# Patient Record
Sex: Male | Born: 1955
Health system: Southern US, Community
[De-identification: ages and names within clinical notes are randomized; demographics above are authoritative.]

## PROBLEM LIST (undated history)

## (undated) DIAGNOSIS — I10 Essential (primary) hypertension: Secondary | ICD-10-CM

## (undated) DIAGNOSIS — E119 Type 2 diabetes mellitus without complications: Secondary | ICD-10-CM

## (undated) HISTORY — DX: Type 2 diabetes mellitus without complications: E11.9

## (undated) HISTORY — DX: Essential (primary) hypertension: I10

---
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
## 1985-11-24 HISTORY — PX: APPENDECTOMY: SHX54

## 1990-11-24 HISTORY — PX: SHOULDER SURGERY: SHX246

## 2002-11-24 HISTORY — PX: HERNIA REPAIR: SHX51

## 2005-08-05 ENCOUNTER — Ambulatory Visit: Payer: Self-pay | Admitting: Podiatry

## 2005-08-19 ENCOUNTER — Encounter: Payer: Self-pay | Admitting: Podiatry

## 2005-08-24 ENCOUNTER — Encounter: Payer: Self-pay | Admitting: Podiatry

## 2005-10-10 ENCOUNTER — Ambulatory Visit: Payer: Self-pay | Admitting: Podiatry

## 2006-11-24 HISTORY — PX: ANKLE SURGERY: SHX546

## 2007-05-21 ENCOUNTER — Ambulatory Visit: Payer: Self-pay | Admitting: Surgery

## 2007-05-21 ENCOUNTER — Other Ambulatory Visit: Payer: Self-pay

## 2007-05-27 ENCOUNTER — Ambulatory Visit: Payer: Self-pay | Admitting: Surgery

## 2011-12-27 ENCOUNTER — Emergency Department: Payer: Self-pay | Admitting: Emergency Medicine

## 2011-12-27 LAB — COMPREHENSIVE METABOLIC PANEL
Albumin: 3.9 g/dL (ref 3.4–5.0)
Alkaline Phosphatase: 50 U/L (ref 50–136)
Anion Gap: 13 (ref 7–16)
BUN: 14 mg/dL (ref 7–18)
Calcium, Total: 8.9 mg/dL (ref 8.5–10.1)
Chloride: 99 mmol/L (ref 98–107)
Creatinine: 1.01 mg/dL (ref 0.60–1.30)
EGFR (African American): >60
Glucose: 128 mg/dL — ABNORMAL HIGH (ref 65–99)
Osmolality: 274 (ref 275–301)
Sodium: 136 mmol/L (ref 136–145)
Total Protein: 7.9 g/dL (ref 6.4–8.2)

## 2011-12-27 LAB — CBC WITH DIFFERENTIAL/PLATELET
Basophil #: 0.1 10*3/uL (ref 0.0–0.1)
Basophil %: 0.6 %
Eosinophil %: 2.1 %
Lymphocyte #: 3.8 10*3/uL — ABNORMAL HIGH (ref 1.0–3.6)
Lymphocyte %: 38.2 %
MCV: 90 fL (ref 80–100)
Monocyte #: 0.8 10*3/uL — ABNORMAL HIGH (ref 0.0–0.7)
Monocyte %: 8.1 %
Neutrophil %: 51 %
Platelet: 236 10*3/uL (ref 150–440)
RBC: 4.76 10*6/uL (ref 4.40–5.90)
WBC: 9.9 10*3/uL (ref 3.8–10.6)

## 2011-12-27 LAB — PROTIME-INR
INR: 0.9
Prothrombin Time: 12.3 secs (ref 11.5–14.7)

## 2011-12-27 LAB — URINALYSIS, COMPLETE
Blood: NEGATIVE
Ketone: NEGATIVE
Nitrite: NEGATIVE
Protein: NEGATIVE
Specific Gravity: 1.004 (ref 1.003–1.030)

## 2011-12-27 LAB — CK TOTAL AND CKMB (NOT AT ARMC): CK, Total: 165 U/L (ref 35–232)

## 2011-12-27 LAB — ETHANOL: Ethanol %: 0.191 % — ABNORMAL HIGH (ref 0.000–0.080)

## 2012-11-24 HISTORY — PX: HERNIA REPAIR: SHX51

## 2013-12-22 ENCOUNTER — Ambulatory Visit: Payer: Self-pay | Admitting: Anesthesiology

## 2013-12-22 DIAGNOSIS — I10 Essential (primary) hypertension: Secondary | ICD-10-CM

## 2013-12-22 LAB — CBC WITH DIFFERENTIAL/PLATELET
BASOS ABS: 0.1 10*3/uL (ref 0.0–0.1)
Basophil %: 0.7 %
Eosinophil #: 0.1 10*3/uL (ref 0.0–0.7)
Eosinophil %: 1.4 %
HCT: 44.4 % (ref 40.0–52.0)
HGB: 14.8 g/dL (ref 13.0–18.0)
Lymphocyte #: 2.3 10*3/uL (ref 1.0–3.6)
Lymphocyte %: 24 %
MCH: 30.4 pg (ref 26.0–34.0)
MCHC: 33.2 g/dL (ref 32.0–36.0)
MCV: 92 fL (ref 80–100)
MONOS PCT: 7.1 %
Monocyte #: 0.7 x10 3/mm (ref 0.2–1.0)
Neutrophil #: 6.3 10*3/uL (ref 1.4–6.5)
Neutrophil %: 66.8 %
Platelet: 244 10*3/uL (ref 150–440)
RBC: 4.85 10*6/uL (ref 4.40–5.90)
RDW: 13 % (ref 11.5–14.5)
WBC: 9.5 10*3/uL (ref 3.8–10.6)

## 2013-12-22 LAB — BASIC METABOLIC PANEL
Anion Gap: 3 — ABNORMAL LOW (ref 7–16)
BUN: 17 mg/dL (ref 7–18)
CHLORIDE: 99 mmol/L (ref 98–107)
CREATININE: 0.99 mg/dL (ref 0.60–1.30)
Calcium, Total: 9.7 mg/dL (ref 8.5–10.1)
Co2: 30 mmol/L (ref 21–32)
EGFR (African American): >60
EGFR (Non-African Amer.): >60
GLUCOSE: 96 mg/dL (ref 65–99)
OSMOLALITY: 266 (ref 275–301)
POTASSIUM: 4.1 mmol/L (ref 3.5–5.1)
Sodium: 132 mmol/L — ABNORMAL LOW (ref 136–145)

## 2013-12-28 ENCOUNTER — Ambulatory Visit: Payer: Self-pay | Admitting: Surgery

## 2013-12-30 LAB — PATHOLOGY REPORT

## 2014-03-22 DIAGNOSIS — I10 Essential (primary) hypertension: Secondary | ICD-10-CM | POA: Insufficient documentation

## 2015-01-12 ENCOUNTER — Ambulatory Visit: Payer: Self-pay | Admitting: Gastroenterology

## 2015-01-12 LAB — HM COLONOSCOPY

## 2015-03-17 NOTE — Op Note (Signed)
PATIENT NAME:  David Haley, David Haley MR#:  [id number] DATE OF BIRTH:  1956/02/13  DATE OF PROCEDURE:  12/28/2013  PREOPERATIVE DIAGNOSIS: Recurrent umbilical hernia.   POSTOPERATIVE DIAGNOSIS: Recurrent umbilical hernia.  PROCEDURE PERFORMED: Robot-assisted laparoscopic umbilical hernia repair.   ANESTHESIA: General.   SURGEON: Quentin Orealph L. Ely, MD.   OPERATIVE PROCEDURE: With the patient in the supine position after induction of appropriate general anesthesia, the patient's abdomen was prepped with ChloraPrep and draped in sterile towels. The patient was placed in slightly Trendelenburg position. The mid left abdomen was incised and using an Optiview trocar the abdomen was cannulated. The Optiview port was then traded for a bariatric 12 port. Two robotic ports were placed under direct vision and assistant port 10 mm in size were placed under direct vision. The robot was brought to the table docked to the ports and the arms inserted under direct vision. The hernia appeared to be a small, approximately 3 cm defect at the umbilicus. The material in the umbilicus appeared to be simply to be preperitoneal and omental fat. It was all reduced without difficulty. Hemostasis was achieved with Bovie. The defect was then closed longitudinally using the quill suture in a running fashion.3-D Max mesh 11 cm in diameter was introduced into the abdominal cavity and pulled up against the abdominal wall. The balloon was inflated. The mesh was then sewn to the anterior abdominal wall using 0 Ethibond in a running fashion. The repair appeared to be secured. The balloon was deflated and removed. The abdomen appeared to have a reasonable coverage several centimeters in every direction. The abdomen was desufflated. The robot removed. All ports were withdrawn without difficulty. The incisions were infiltrated with 0.25% Marcaine for postoperative pain control and closed with 5-0 nylon.   The patient's chest was then prepped and  draped in sterile fashion and the lesion on his chest wall elliptically incised  and removed without difficulty. Hemostasis was achieved with Bovie electrocautery. Closure was accomplished with 0 Vicryl and 3-0 nylon. Sterile dressings were applied.   The patient was returned to the recovery room, having tolerated the procedure well. Sponge, instrument and needle counts were correct x 2 in the operating room.     ____________________________ Carmie Endalph L. Ely III, MD rle:sg D: 12/28/2013 11:05:53 ET T: 12/28/2013 12:29:39 ET JOB#: [id number]  cc: Carmie Endalph L. Ely III, MD, <Dictator> Jorje GuildGlenn R. Beckey DowningWillett, MD  Quentin OreALPH L ELY MD ELECTRONICALLY SIGNED 01/02/2014 22:56

## 2015-03-19 LAB — SURGICAL PATHOLOGY

## 2015-11-25 HISTORY — PX: SQUAMOUS CELL CARCINOMA EXCISION: SHX2433

## 2018-01-22 HISTORY — PX: OTHER SURGICAL HISTORY: SHX169

## 2018-08-26 ENCOUNTER — Ambulatory Visit (INDEPENDENT_AMBULATORY_CARE_PROVIDER_SITE_OTHER): Payer: PRIVATE HEALTH INSURANCE | Admitting: Family Medicine

## 2018-08-26 ENCOUNTER — Encounter: Payer: Self-pay | Admitting: Family Medicine

## 2018-08-26 VITALS — BP 142/70 | HR 80 | Ht 71.0 in | Wt 289.0 lb

## 2018-08-26 DIAGNOSIS — I10 Essential (primary) hypertension: Secondary | ICD-10-CM

## 2018-08-26 DIAGNOSIS — E119 Type 2 diabetes mellitus without complications: Secondary | ICD-10-CM | POA: Diagnosis not present

## 2018-08-26 DIAGNOSIS — Z7689 Persons encountering health services in other specified circumstances: Secondary | ICD-10-CM | POA: Diagnosis not present

## 2018-08-26 MED ORDER — LISINOPRIL-HYDROCHLOROTHIAZIDE 10-12.5 MG PO TABS: 1.0000 | ORAL_TABLET | Freq: Every day | ORAL | 1 refills | Status: DC

## 2018-08-26 NOTE — Progress Notes (Signed)
Date:  08/26/2018   Name:  David Haley   DOB:  [birth year]   MRN:  [id number]   Chief Complaint: Establish Care (last A1C in July); Hypertension; and Diabetes Patient is a 62 year old male who presents for a comprehensive physical exam. The patient reports the following problems: DM/HTN. Health maintenance has been reviewed UTD  Hypertension  This is a chronic problem. The current episode started more than 1 year ago. The problem is unchanged. The problem is controlled. Pertinent negatives include no anxiety, blurred vision, chest pain, headaches, malaise/fatigue, neck pain, orthopnea, palpitations, peripheral edema, PND, shortness of breath or sweats. There are no associated agents to hypertension. Risk factors for coronary artery disease include diabetes mellitus and dyslipidemia. Past treatments include ACE inhibitors. The current treatment provides mild improvement. There are no compliance problems.  There is no history of angina, kidney disease, CAD/MI, CVA, heart failure, left ventricular hypertrophy, PVD or retinopathy. There is no history of chronic renal disease, a hypertension causing med or renovascular disease.  Diabetes  He presents for his follow-up diabetic visit. He has type 2 diabetes mellitus. His disease course has been stable. Pertinent negatives for hypoglycemia include no confusion, dizziness, headaches, hunger, mood changes, nervousness/anxiousness, pallor, seizures, sleepiness, speech difficulty, sweats or tremors. Pertinent negatives for diabetes include no blurred vision, no chest pain, no fatigue, no foot paresthesias, no foot ulcerations, no polydipsia, no polyphagia, no polyuria, no visual change, no weakness and no weight loss. There are no hypoglycemic complications. Symptoms are stable. There are no diabetic complications. Pertinent negatives for diabetic complications include no autonomic neuropathy, CVA, heart disease, impotence, nephropathy, peripheral neuropathy,  PVD or retinopathy. Risk factors for coronary artery disease include diabetes mellitus, dyslipidemia and hypertension. Current diabetic treatment includes oral agent (monotherapy). He is following a generally healthy diet. He participates in exercise intermittently. His breakfast blood glucose is taken between 8-9 am. An ACE inhibitor/angiotensin II receptor blocker is being taken.     Review of Systems  Constitutional: Negative for chills, fatigue, fever, malaise/fatigue and weight loss.  HENT: Negative for drooling, ear discharge, ear pain, nosebleeds, postnasal drip, rhinorrhea and sore throat.   Eyes: Negative for blurred vision, pain, redness and itching.  Respiratory: Negative for cough, shortness of breath and wheezing.   Cardiovascular: Negative for chest pain, palpitations, orthopnea, leg swelling and PND.  Gastrointestinal: Negative for abdominal pain, blood in stool, constipation, diarrhea and nausea.  Endocrine: Negative for polydipsia, polyphagia and polyuria.  Genitourinary: Negative for dysuria, frequency, hematuria, impotence and urgency.  Musculoskeletal: Positive for back pain. Negative for myalgias and neck pain.  Skin: Negative for pallor and rash.  Allergic/Immunologic: Negative for environmental allergies.  Neurological: Negative for dizziness, tremors, seizures, speech difficulty, weakness, numbness and headaches.  Hematological: Does not bruise/bleed easily.  Psychiatric/Behavioral: Negative for confusion and suicidal ideas. The patient is not nervous/anxious.     Patient Active Problem List   Diagnosis Date Noted  . Diabetes mellitus, type 2 (HCC) 08/26/2018  . Obesity, morbid, BMI 40.0-49.9 (HCC) 08/26/2018  . HTN (hypertension) 03/22/2014    No Known Allergies  Past Surgical History:  Procedure Laterality Date  . ANKLE SURGERY Right 2008   tendon repair  . APPENDECTOMY  1987  . HERNIA REPAIR  2004   umbilical  . HERNIA REPAIR  2014   mesh  .  precancerous mole Left 01/2018   shoulder  . SHOULDER SURGERY Left 1992   arthroscopy  . SQUAMOUS CELL CARCINOMA EXCISION Right  2017   R) arm   . TONSILLECTOMY  1968    Social History   Tobacco Use  . Smoking status: Never Smoker  . Smokeless tobacco: Never Used  Substance Use Topics  . Alcohol use: Yes    Comment: 5-6 /week  . Drug use: Yes    Types: Marijuana     Medication list has been reviewed and updated.  Current Meds  Medication Sig  . Ascorbic Acid (VITAMIN C) 1000 MG tablet Take 2 tablets by mouth daily.  Marland Kitchen aspirin EC 81 MG tablet Take 1 tablet by mouth daily.  . cephALEXin (KEFLEX) 500 MG capsule Take 1 capsule by mouth 2 (two) times daily.  . fluocinonide (LIDEX) 0.05 % external solution APP EXT AA QD PRF SPOT TREATMENT  . ketoconazole (NIZORAL) 2 % shampoo APP EXT QD  . lisinopril-hydrochlorothiazide (PRINZIDE,ZESTORETIC) 10-12.5 MG tablet Take 1 tablet by mouth daily.  . metFORMIN (GLUCOPHAGE) 1000 MG tablet Take 1 tablet by mouth daily.  . [DISCONTINUED] lisinopril-hydrochlorothiazide (PRINZIDE,ZESTORETIC) 10-12.5 MG tablet Take 1 tablet by mouth daily.    PHQ 2/9 Scores 08/26/2018  PHQ - 2 Score 0  PHQ- 9 Score 0    Physical Exam  Constitutional: He is oriented to person, place, and time.  HENT:  Head: Normocephalic.  Right Ear: External ear normal.  Left Ear: External ear normal.  Nose: Nose normal.  Mouth/Throat: Oropharynx is clear and moist.  Eyes: Pupils are equal, round, and reactive to light. Conjunctivae and EOM are normal. Right eye exhibits no discharge. Left eye exhibits no discharge. No scleral icterus.  Neck: Normal range of motion. Neck supple. No JVD present. No tracheal deviation present. No thyromegaly present.  Cardiovascular: Normal rate, regular rhythm, normal heart sounds and intact distal pulses. Exam reveals no gallop and no friction rub.  No murmur heard. Pulmonary/Chest: Breath sounds normal. No respiratory distress. He has  no wheezes. He has no rales.  Abdominal: Soft. Bowel sounds are normal. He exhibits no mass. There is no hepatosplenomegaly. There is no tenderness. There is no rebound, no guarding and no CVA tenderness.  Musculoskeletal: Normal range of motion. He exhibits no edema or tenderness.  Feet:  Right Foot:  Protective Sensation: 10 sites tested. 10 sites sensed.  Skin Integrity: Negative for ulcer, blister, skin breakdown, erythema, warmth, callus or dry skin.  Left Foot:  Protective Sensation: 10 sites tested. 10 sites sensed.  Skin Integrity: Negative for ulcer, blister, skin breakdown, erythema, warmth, callus or dry skin.  Lymphadenopathy:    He has no cervical adenopathy.  Neurological: He is alert and oriented to person, place, and time. He has normal strength and normal reflexes. No cranial nerve deficit.  Skin: Skin is warm. No rash noted.  Nursing note and vitals reviewed.   BP (!) 142/70   Pulse 80   Ht 5\' 11"  (1.803 m)   Wt 289 lb (131.1 kg)   BMI 40.31 kg/m   Assessment and Plan:  1. Establishing care with new doctor, encounter for Establishing with a new physician for continuing care for hypertension and diabetes  2. Essential hypertension Prescribe lisinopril-hctz/ recheck at physical visit - lisinopril-hydrochlorothiazide (PRINZIDE,ZESTORETIC) 10-12.5 MG tablet; Take 1 tablet by mouth daily.  Dispense: 90 tablet; Refill: 1  3. Type 2 diabetes mellitus without complication, without long-term current use of insulin (HCC) Stable on meds- had A1C in July at previous doctor's office   Dr. Elizabeth Sauer [facility]  08/26/2018

## 2018-10-11 ENCOUNTER — Ambulatory Visit (INDEPENDENT_AMBULATORY_CARE_PROVIDER_SITE_OTHER): Payer: PRIVATE HEALTH INSURANCE | Admitting: Family Medicine

## 2018-10-11 ENCOUNTER — Encounter: Payer: Self-pay | Admitting: Family Medicine

## 2018-10-11 VITALS — BP 130/80 | HR 72 | Ht 71.0 in | Wt 278.0 lb

## 2018-10-11 DIAGNOSIS — E785 Hyperlipidemia, unspecified: Secondary | ICD-10-CM

## 2018-10-11 DIAGNOSIS — Z0000 Encounter for general adult medical examination without abnormal findings: Secondary | ICD-10-CM

## 2018-10-11 DIAGNOSIS — Z1211 Encounter for screening for malignant neoplasm of colon: Secondary | ICD-10-CM | POA: Diagnosis not present

## 2018-10-11 DIAGNOSIS — E119 Type 2 diabetes mellitus without complications: Secondary | ICD-10-CM | POA: Diagnosis not present

## 2018-10-11 DIAGNOSIS — R351 Nocturia: Secondary | ICD-10-CM | POA: Diagnosis not present

## 2018-10-11 LAB — HEMOCCULT GUIAC POC 1CARD (OFFICE): Fecal Occult Blood, POC: NEGATIVE

## 2018-10-11 NOTE — Progress Notes (Signed)
Date:  10/11/2018   Name:  David HillierKenneth E Haley   DOB:  02/04/1956   MRN:  [id number]   Chief Complaint: Annual Exam (no concerns) Patient is a 62 year old male who presents for a comprehensive physical exam. The patient reports the following problems: none. Health maintenance has been reviewed up to date.    Review of Systems  Constitutional: Negative for chills and fever.  HENT: Negative for drooling, ear discharge, ear pain and sore throat.   Respiratory: Negative for cough, shortness of breath and wheezing.   Cardiovascular: Negative for chest pain, palpitations and leg swelling.  Gastrointestinal: Negative for abdominal pain, blood in stool, constipation, diarrhea and nausea.  Endocrine: Negative for polydipsia.  Genitourinary: Negative for dysuria, frequency, hematuria and urgency.       Nocturia  Musculoskeletal: Negative for back pain, myalgias and neck pain.  Skin: Negative for rash.  Allergic/Immunologic: Negative for environmental allergies.  Neurological: Negative for dizziness and headaches.  Hematological: Does not bruise/bleed easily.  Psychiatric/Behavioral: Negative for suicidal ideas. The patient is not nervous/anxious.     Patient Active Problem List   Diagnosis Date Noted  . Diabetes mellitus, type 2 (HCC) 08/26/2018  . Obesity, morbid, BMI 40.0-49.9 (HCC) 08/26/2018  . HTN (hypertension) 03/22/2014    No Known Allergies  Past Surgical History:  Procedure Laterality Date  . ANKLE SURGERY Right 2008   tendon repair  . APPENDECTOMY  1987  . HERNIA REPAIR  2004   umbilical  . HERNIA REPAIR  2014   mesh  . precancerous mole Left 01/2018   shoulder  . SHOULDER SURGERY Left 1992   arthroscopy  . SQUAMOUS CELL CARCINOMA EXCISION Right 2017   R) arm   . TONSILLECTOMY  1968    Social History   Tobacco Use  . Smoking status: Never Smoker  . Smokeless tobacco: Never Used  Substance Use Topics  . Alcohol use: Yes    Comment: 5-6 /week  . Drug use:  Yes    Types: Marijuana     Medication list has been reviewed and updated.  Current Meds  Medication Sig  . Ascorbic Acid (VITAMIN C) 1000 MG tablet Take 2 tablets by mouth daily.  Marland Kitchen. aspirin EC 81 MG tablet Take 1 tablet by mouth daily.  . fluocinonide (LIDEX) 0.05 % external solution APP EXT AA QD PRF SPOT TREATMENT  . ketoconazole (NIZORAL) 2 % shampoo APP EXT QD  . lisinopril-hydrochlorothiazide (PRINZIDE,ZESTORETIC) 10-12.5 MG tablet Take 1 tablet by mouth daily.  . metFORMIN (GLUCOPHAGE) 1000 MG tablet Take 1 tablet by mouth daily.    PHQ 2/9 Scores 08/26/2018  PHQ - 2 Score 0  PHQ- 9 Score 0    Physical Exam  Constitutional: He is oriented to person, place, and time. Vital signs are normal. He appears well-developed and well-nourished.  HENT:  Head: Normocephalic.  Right Ear: Hearing, tympanic membrane, external ear and ear canal normal.  Left Ear: Hearing, tympanic membrane, external ear and ear canal normal.  Nose: Nose normal. No mucosal edema.  Mouth/Throat: Uvula is midline and oropharynx is clear and moist. No oropharyngeal exudate, posterior oropharyngeal edema or posterior oropharyngeal erythema.  Eyes: Pupils are equal, round, and reactive to light. Conjunctivae, EOM and lids are normal. Right eye exhibits no discharge. Left eye exhibits no discharge. No scleral icterus.  Fundoscopic exam:      The right eye shows no AV nicking.       The left eye shows no AV  nicking.  Neck: Trachea normal and normal range of motion. Neck supple. Normal carotid pulses, no hepatojugular reflux and no JVD present. Carotid bruit is not present. No tracheal deviation, no edema and no erythema present. No thyroid mass and no thyromegaly present.  Cardiovascular: Normal rate, regular rhythm, S1 normal, S2 normal, intact distal pulses and normal pulses. PMI is not displaced. Exam reveals no gallop, no S3, no S4, no distant heart sounds, no friction rub and no decreased pulses.  Murmur  heard.  Systolic murmur is present with a grade of 1/6.  No diastolic murmur is present. Pulses:      Carotid pulses are 2+ on the right side, and 2+ on the left side.      Radial pulses are 2+ on the right side, and 2+ on the left side.       Femoral pulses are 2+ on the right side, and 2+ on the left side.      Popliteal pulses are 2+ on the right side, and 2+ on the left side.       Dorsalis pedis pulses are 2+ on the right side, and 2+ on the left side.       Posterior tibial pulses are 2+ on the right side, and 2+ on the left side.  Pulmonary/Chest: Effort normal and breath sounds normal. No stridor. No respiratory distress. He has no decreased breath sounds. He has no wheezes. He has no rhonchi. He has no rales. He exhibits no mass. Right breast exhibits no mass. Left breast exhibits no mass.  Abdominal: Soft. Normal aorta and bowel sounds are normal. He exhibits no mass. There is no hepatosplenomegaly. There is no tenderness. There is no rebound, no guarding and no CVA tenderness. Hernia confirmed negative in the right inguinal area and confirmed negative in the left inguinal area.  Genitourinary: Rectum normal, prostate normal, testes normal and penis normal. Rectal exam shows guaiac negative stool. Prostate is not enlarged and not tender. Right testis shows no mass, no swelling and no tenderness. Left testis shows no mass, no swelling and no tenderness. Circumcised.  Musculoskeletal: Normal range of motion. He exhibits no edema or tenderness.       Cervical back: Normal.       Thoracic back: Normal.       Lumbar back: Normal.  Lymphadenopathy:       Head (right side): No submental and no submandibular adenopathy present.       Head (left side): No submental and no submandibular adenopathy present.    He has no cervical adenopathy.    He has no axillary adenopathy. No inguinal adenopathy noted on the right or left side.  Neurological: He is alert and oriented to person, place, and time.  He has normal strength and normal reflexes. He displays normal reflexes. No cranial nerve deficit or sensory deficit.  Reflex Scores:      Tricep reflexes are 2+ on the right side and 2+ on the left side.      Bicep reflexes are 2+ on the right side and 2+ on the left side.      Brachioradialis reflexes are 2+ on the right side and 2+ on the left side.      Patellar reflexes are 2+ on the right side and 2+ on the left side.      Achilles reflexes are 2+ on the right side and 2+ on the left side. Skin: Skin is warm, dry and intact. Capillary refill takes less than  2 seconds. No rash noted. No pallor.  Nursing note and vitals reviewed.   BP 130/80   Pulse 72   Ht 5\' 11"  (1.803 m)   Wt 278 lb (126.1 kg)   BMI 38.77 kg/m   Assessment and Plan:  1. Physical exam, annual Tavion Senkbeil Jared is a 63 y.o. male who presents today for his Complete Annual Exam. He feels well. He reports exercising . He reports he is sleeping well.  Immunizations are reviewed and recommendations provided.   Age appropriate screening tests are discussed. Counseling given for risk factor reduction interventions. 2. Type 2 diabetes mellitus without complication, without long-term current use of insulin (HCC) Followed by endocrine Continue metformen and weight loss.  Reviewed labs from endocrine clinic.  3. Nocturia History nocturia at least once a night. Check PSA - PSA  4. Hyperlipidemia, unspecified hyperlipidemia type Health risks of being over weight were discussed and patient was counseled on weight loss options and exercise.  5. Colon cancer screening Guaiac negative - POCT Occult Blood Stool   Dr. Elizabeth Sauer [facility]  10/11/2018

## 2018-10-12 LAB — PSA: Prostate Specific Ag, Serum: 0.4 ng/mL (ref 0.0–4.0)

## 2019-04-11 ENCOUNTER — Encounter: Payer: Self-pay | Admitting: Family Medicine

## 2019-04-11 ENCOUNTER — Other Ambulatory Visit: Payer: Self-pay

## 2019-04-11 ENCOUNTER — Ambulatory Visit: Payer: Commercial Managed Care - PPO | Admitting: Family Medicine

## 2019-04-11 VITALS — BP 124/70 | HR 88 | Ht 71.0 in | Wt 279.0 lb

## 2019-04-11 DIAGNOSIS — I10 Essential (primary) hypertension: Secondary | ICD-10-CM

## 2019-04-11 DIAGNOSIS — E781 Pure hyperglyceridemia: Secondary | ICD-10-CM | POA: Diagnosis not present

## 2019-04-11 DIAGNOSIS — E119 Type 2 diabetes mellitus without complications: Secondary | ICD-10-CM | POA: Diagnosis not present

## 2019-04-11 DIAGNOSIS — M5136 Other intervertebral disc degeneration, lumbar region: Secondary | ICD-10-CM

## 2019-04-11 DIAGNOSIS — Z23 Encounter for immunization: Secondary | ICD-10-CM

## 2019-04-11 DIAGNOSIS — Z6838 Body mass index (BMI) 38.0-38.9, adult: Secondary | ICD-10-CM

## 2019-04-11 MED ORDER — ASPIRIN EC 81 MG PO TBEC: 81.0000 mg | DELAYED_RELEASE_TABLET | Freq: Every day | ORAL | 1 refills | Status: DC

## 2019-04-11 MED ORDER — LISINOPRIL-HYDROCHLOROTHIAZIDE 10-12.5 MG PO TABS: 1.0000 | ORAL_TABLET | Freq: Every day | ORAL | 1 refills | Status: DC

## 2019-04-11 MED ORDER — METFORMIN HCL 1000 MG PO TABS: 1000.0000 mg | ORAL_TABLET | Freq: Every day | ORAL | 1 refills | Status: DC

## 2019-04-11 NOTE — Progress Notes (Signed)
Date:  04/11/2019   Name:  David Haley   DOB:  07/21/1956   MRN:  [id number]   Chief Complaint: Hypertension and Diabetes  Hypertension  This is a chronic problem. The current episode started more than 1 year ago. The problem is unchanged. The problem is controlled. Pertinent negatives include no anxiety, blurred vision, chest pain, headaches, malaise/fatigue, neck pain, orthopnea, palpitations, peripheral edema, PND, shortness of breath or sweats. There are no associated agents to hypertension. Risk factors for coronary artery disease include diabetes mellitus and dyslipidemia. Past treatments include angiotensin blockers and diuretics. The current treatment provides moderate improvement. There are no compliance problems.  There is no history of angina, kidney disease, CAD/MI, CVA, heart failure, left ventricular hypertrophy, PVD or retinopathy. There is no history of chronic renal disease, a hypertension causing med or renovascular disease.  Diabetes  He presents for his follow-up diabetic visit. He has type 2 diabetes mellitus. His disease course has been stable. There are no hypoglycemic associated symptoms. Pertinent negatives for hypoglycemia include no dizziness, headaches, nervousness/anxiousness, pallor, sleepiness or sweats. Pertinent negatives for diabetes include no blurred vision, no chest pain, no fatigue, no foot paresthesias, no foot ulcerations, no polydipsia, no polyuria, no visual change, no weakness and no weight loss. There are no hypoglycemic complications. Symptoms are stable. There are no diabetic complications. Pertinent negatives for diabetic complications include no CVA, PVD or retinopathy. Risk factors for coronary artery disease include dyslipidemia, diabetes mellitus, hypertension, male sex and obesity. Current diabetic treatment includes oral agent (monotherapy). He is compliant with treatment most of the time. His weight is stable. He is following a generally healthy  diet. He participates in exercise daily. His breakfast blood glucose is taken between 8-9 am. His breakfast blood glucose range is generally 130-140 mg/dl. An ACE inhibitor/angiotensin II receptor blocker is being taken. Eye exam is not current.  Hyperlipidemia  This is a chronic problem. The current episode started more than 1 year ago. The problem is controlled. Exacerbating diseases include diabetes and obesity. He has no history of chronic renal disease, hypothyroidism, liver disease or nephrotic syndrome. Factors aggravating his hyperlipidemia include thiazides. Pertinent negatives include no chest pain, focal sensory loss, focal weakness, leg pain, myalgias or shortness of breath. Current antihyperlipidemic treatment includes diet change. The current treatment provides moderate improvement of lipids. There are no compliance problems.  Risk factors for coronary artery disease include dyslipidemia.    Review of Systems  Constitutional: Negative for chills, fatigue, fever, malaise/fatigue and weight loss.  HENT: Negative for drooling, ear discharge, ear pain and sore throat.   Eyes: Negative for blurred vision.  Respiratory: Negative for cough, shortness of breath and wheezing.   Cardiovascular: Negative for chest pain, palpitations, orthopnea, leg swelling and PND.  Gastrointestinal: Negative for abdominal pain, blood in stool, constipation, diarrhea and nausea.  Endocrine: Negative for polydipsia and polyuria.  Genitourinary: Negative for dysuria, frequency, hematuria and urgency.  Musculoskeletal: Negative for back pain, myalgias and neck pain.  Skin: Negative for pallor and rash.  Allergic/Immunologic: Negative for environmental allergies.  Neurological: Negative for dizziness, focal weakness, weakness and headaches.  Hematological: Does not bruise/bleed easily.  Psychiatric/Behavioral: Negative for suicidal ideas. The patient is not nervous/anxious.     Patient Active Problem List    Diagnosis Date Noted  . Diabetes mellitus, type 2 (HCC) 08/26/2018  . Obesity, morbid, BMI 40.0-49.9 (HCC) 08/26/2018  . HTN (hypertension) 03/22/2014    No Known Allergies  Past Surgical History:  Procedure Laterality Date  . ANKLE SURGERY Right 2008   tendon repair  . APPENDECTOMY  1987  . HERNIA REPAIR  2004   umbilical  . HERNIA REPAIR  2014   mesh  . precancerous mole Left 01/2018   shoulder  . SHOULDER SURGERY Left 1992   arthroscopy  . SQUAMOUS CELL CARCINOMA EXCISION Right 2017   R) arm   . TONSILLECTOMY  1968    Social History   Tobacco Use  . Smoking status: Never Smoker  . Smokeless tobacco: Never Used  Substance Use Topics  . Alcohol use: Yes    Comment: 5-6 /week  . Drug use: Yes    Types: Marijuana     Medication list has been reviewed and updated.  Current Meds  Medication Sig  . aspirin EC 81 MG tablet Take 1 tablet by mouth daily.  Marland Kitchen lisinopril-hydrochlorothiazide (PRINZIDE,ZESTORETIC) 10-12.5 MG tablet Take 1 tablet by mouth daily.  . metFORMIN (GLUCOPHAGE) 1000 MG tablet Take 1 tablet by mouth daily.  . [DISCONTINUED] Ascorbic Acid (VITAMIN C) 1000 MG tablet Take 2 tablets by mouth daily.    PHQ 2/9 Scores 04/11/2019 08/26/2018  PHQ - 2 Score 0 0  PHQ- 9 Score 0 0    BP Readings from Last 3 Encounters:  04/11/19 124/70  10/11/18 130/80  08/26/18 (!) 142/70    Physical Exam Vitals signs and nursing note reviewed.  Constitutional:      Appearance: Normal appearance. He is overweight.  HENT:     Head: Normocephalic.     Right Ear: Hearing, tympanic membrane, ear canal and external ear normal.     Left Ear: Hearing, tympanic membrane, ear canal and external ear normal.     Nose: Nose normal.  Eyes:     General: No scleral icterus.       Right eye: No discharge.        Left eye: No discharge.     Conjunctiva/sclera: Conjunctivae normal.     Pupils: Pupils are equal, round, and reactive to light.  Neck:     Musculoskeletal: Normal  range of motion and neck supple.     Thyroid: No thyromegaly.     Vascular: No JVD.     Trachea: No tracheal deviation.  Cardiovascular:     Rate and Rhythm: Normal rate and regular rhythm.     Chest Wall: PMI is not displaced. No thrill.     Pulses: Normal pulses.          Carotid pulses are 2+ on the right side and 2+ on the left side.      Radial pulses are 2+ on the right side and 2+ on the left side.       Femoral pulses are 2+ on the right side and 2+ on the left side.      Popliteal pulses are 2+ on the right side and 2+ on the left side.       Dorsalis pedis pulses are 2+ on the right side and 2+ on the left side.       Posterior tibial pulses are 2+ on the right side and 2+ on the left side.     Heart sounds: Normal heart sounds, S1 normal and S2 normal. No murmur. No systolic murmur. No diastolic murmur. No friction rub. No gallop. No S3 or S4 sounds.   Pulmonary:     Effort: No respiratory distress.     Breath sounds: Normal breath sounds. No decreased breath sounds, wheezing,  rhonchi or rales.  Chest:     Breasts:        Right: Normal.        Left: Normal.  Abdominal:     General: Bowel sounds are normal.     Palpations: Abdomen is soft. There is no hepatomegaly, splenomegaly or mass.     Tenderness: There is no abdominal tenderness. There is no guarding or rebound.  Musculoskeletal: Normal range of motion.        General: No tenderness.     Lumbar back: He exhibits spasm. He exhibits no tenderness and no swelling.     Right lower leg: No edema.     Left lower leg: No edema.  Feet:     Right foot:     Protective Sensation: 10 sites tested. 10 sites sensed.     Skin integrity: Callus present. No ulcer, blister, skin breakdown, erythema, warmth, dry skin or fissure.     Left foot:     Protective Sensation: 10 sites tested. 10 sites sensed.     Skin integrity: Callus present. No ulcer, blister, skin breakdown, erythema, warmth, dry skin or fissure.  Lymphadenopathy:      Cervical: No cervical adenopathy.  Skin:    General: Skin is warm.     Findings: No rash.  Neurological:     Mental Status: He is alert and oriented to person, place, and time.     Cranial Nerves: No cranial nerve deficit.     Deep Tendon Reflexes: Reflexes are normal and symmetric.     Wt Readings from Last 3 Encounters:  04/11/19 279 lb (126.6 kg)  10/11/18 278 lb (126.1 kg)  08/26/18 289 lb (131.1 kg)    BP 124/70   Pulse 88   Ht  (1.803 m)   Wt 279 lb (126.6 kg)   BMI 38.91 kg/m   Assessment and Plan:  1. Type 2 diabetes mellitus without complication, without long-term current use of insulin (HCC) Chronic.  Controlled.  Blood sugars are in the 130 range will continue on metformin 1 g once a day.  Will check renal function panel A1c and microalbuminuria. - Renal Function Panel - Hemoglobin A1c - Lipid Panel With LDL/HDL Ratio - Microalbumin / creatinine urine ratio  2. Essential hypertension Chronic.  Controlled.  Continue lisinopril hydrochlorothiazide on a day.  Will check renal function panel to assess GFR. - lisinopril-hydrochlorothiazide (ZESTORETIC) 10-12.5 MG tablet; Take 1 tablet by mouth daily.  Dispense: 90 tablet; Refill: 1 - Renal Function Panel  3. Pure hypertriglyceridemia Chronic.  Controlled.  Will give low-cholesterol diet sheet to patient and check lipid panel with ratio. - Lipid Panel With LDL/HDL Ratio  4. DDD (degenerative disc disease), lumbar Patient has some discomfort in the generative disc disease nature of the lumbar area.  5. BMI 38.0-38.9,adult Health risks of being over weight were discussed and patient was counseled on weight loss options and exercise. - Renal Function Panel - Hemoglobin A1c - Lipid Panel With LDL/HDL Ratio  6. Need for vaccination for zoster Discussed a possibility of a Shingrix injection with patient patient will consult with human relations at work if this will be covered for Shingrix and will either call  us or seek ministration at pharmacy.

## 2019-04-11 NOTE — Patient Instructions (Signed)

## 2019-04-12 ENCOUNTER — Ambulatory Visit (INDEPENDENT_AMBULATORY_CARE_PROVIDER_SITE_OTHER): Payer: Commercial Managed Care - PPO

## 2019-04-12 DIAGNOSIS — Z23 Encounter for immunization: Secondary | ICD-10-CM

## 2019-04-15 ENCOUNTER — Encounter: Payer: Self-pay | Admitting: Family Medicine

## 2019-04-15 LAB — RENAL FUNCTION PANEL
Albumin: 4.9 g/dL (ref 3.8–4.9)
BUN/Creatinine Ratio: 15 (ref 10–24)
BUN: 15 mg/dL (ref 8–27)
CO2: 20 mmol/L (ref 20–29)
Calcium: 10.3 mg/dL — ABNORMAL HIGH (ref 8.6–10.2)
Chloride: 99 mmol/L (ref 96–106)
Creatinine, Ser: 1.03 mg/dL (ref 0.76–1.27)
GFR calc Af Amer: 91 mL/min/{1.73_m2} (ref >59)
GFR calc non Af Amer: 79 mL/min/{1.73_m2} (ref >59)
Glucose: 135 mg/dL — ABNORMAL HIGH (ref 65–99)
Phosphorus: 3.2 mg/dL (ref 2.8–4.1)
Potassium: 4.4 mmol/L (ref 3.5–5.2)
Sodium: 136 mmol/L (ref 134–144)

## 2019-04-15 LAB — LIPID PANEL WITH LDL/HDL RATIO
Cholesterol, Total: 162 mg/dL (ref 100–199)
HDL: 40 mg/dL (ref >39)
LDL Calculated: 89 mg/dL (ref 0–99)
LDl/HDL Ratio: 2.2 ratio (ref 0.0–3.6)
Triglycerides: 164 mg/dL — ABNORMAL HIGH (ref 0–149)
VLDL Cholesterol Cal: 33 mg/dL (ref 5–40)

## 2019-04-15 LAB — MICROALBUMIN / CREATININE URINE RATIO

## 2019-04-15 LAB — HEMOGLOBIN A1C
Est. average glucose Bld gHb Est-mCnc: 137 mg/dL
Hgb A1c MFr Bld: 6.4 % — ABNORMAL HIGH (ref 4.8–5.6)

## 2019-05-02 ENCOUNTER — Other Ambulatory Visit: Payer: Self-pay

## 2019-05-02 DIAGNOSIS — M549 Dorsalgia, unspecified: Secondary | ICD-10-CM

## 2019-05-02 MED ORDER — CYCLOBENZAPRINE HCL 10 MG PO TABS: 10.0000 mg | ORAL_TABLET | Freq: Three times a day (TID) | ORAL | 0 refills | Status: DC | PRN

## 2019-05-02 MED ORDER — MELOXICAM 15 MG PO TABS: 15.0000 mg | ORAL_TABLET | Freq: Every day | ORAL | 0 refills | Status: DC

## 2019-05-02 NOTE — Progress Notes (Unsigned)
Pt called in with c/o back pain and spasms- sent in meloxicam and flexeril- will see Thursday if not better

## 2019-05-06 ENCOUNTER — Other Ambulatory Visit: Payer: Self-pay

## 2019-05-06 DIAGNOSIS — M549 Dorsalgia, unspecified: Secondary | ICD-10-CM

## 2019-05-06 NOTE — Progress Notes (Unsigned)
Put in ref to ortho 

## 2019-05-13 DIAGNOSIS — M5136 Other intervertebral disc degeneration, lumbar region: Secondary | ICD-10-CM | POA: Insufficient documentation

## 2019-06-15 ENCOUNTER — Ambulatory Visit
Admission: RE | Admit: 2019-06-15 | Discharge: 2019-06-15 | Disposition: A | Discharge status: Home / Self Care | Payer: Commercial Managed Care - PPO | Attending: Family Medicine | Admitting: Family Medicine

## 2019-06-15 ENCOUNTER — Ambulatory Visit: Payer: Commercial Managed Care - PPO | Admitting: Family Medicine

## 2019-06-15 ENCOUNTER — Ambulatory Visit
Admission: RE | Admit: 2019-06-15 | Discharge: 2019-06-15 | Disposition: A | Discharge status: Home / Self Care | Payer: Commercial Managed Care - PPO | Source: Ambulatory Visit | Attending: Family Medicine | Admitting: Family Medicine

## 2019-06-15 ENCOUNTER — Other Ambulatory Visit: Payer: Self-pay

## 2019-06-15 ENCOUNTER — Encounter: Payer: Self-pay | Admitting: Family Medicine

## 2019-06-15 VITALS — BP 140/62 | HR 80 | Ht 71.0 in | Wt 289.0 lb

## 2019-06-15 DIAGNOSIS — Z23 Encounter for immunization: Secondary | ICD-10-CM

## 2019-06-15 DIAGNOSIS — S42435A Nondisplaced fracture (avulsion) of lateral epicondyle of left humerus, initial encounter for closed fracture: Secondary | ICD-10-CM | POA: Insufficient documentation

## 2019-06-15 MED ORDER — MELOXICAM 15 MG PO TABS: 15.0000 mg | ORAL_TABLET | Freq: Every day | ORAL | 1 refills | Status: DC

## 2019-06-15 NOTE — Patient Instructions (Signed)
Tennis Elbow  Tennis elbow (lateral epicondylitis) is inflammation of tendons in your outer forearm, near your elbow. Tendons are tissues that connect muscle to bone. When you have tennis elbow, inflammation affects the tendons that you use to bend your wrist and move your hand up. Inflammation occurs in the lower part of the upper arm bone (humerus), where the tendons connect to the bone (lateral epicondyle). Tennis elbow often affects people who play tennis, but anyone may get the condition from repeatedly extending the wrist or turning the forearm. What are the causes? This condition is usually caused by repeatedly extending the wrist, turning the forearm, and using the hands. It can result from sports or work that requires repetitive forearm movements. In some cases, it may be caused by a sudden injury. What increases the risk? You are more likely to develop tennis elbow if you play tennis or another racket sport. You also have a higher risk if you frequently use your hands for work. Besides people who play tennis, others at greater risk include:  Musicians.  Carpenters, painters, and plumbers.  Cooks.  Cashiers.  People who work in factories.  Construction workers.  Butchers.  People who use computers. What are the signs or symptoms? Symptoms of this condition include:  Pain and tenderness in the forearm and the outer part of the elbow. Pain may be felt only when using the arm, or it may be there all the time.  A burning feeling that starts in the elbow and spreads down the forearm.  A weak grip in the hand. How is this diagnosed? This condition may be diagnosed based on:  Your symptoms and medical history.  A physical exam.  X-rays.  MRI. How is this treated? Resting and icing your arm is often the first treatment. Your health care provider may also recommend:  Medicines to reduce pain and inflammation. These may be in the form of a pill, topical gels, or shots of a  steroid medicine (cortisone).  An elbow strap to reduce stress on the area.  Physical therapy. This may include massage or exercises.  An elbow brace to restrict the movements that cause symptoms. If these treatments do not help relieve your symptoms, your health care provider may recommend surgery to remove damaged muscle and reattach healthy muscle to bone. Follow these instructions at home: Activity  Rest your elbow and wrist and avoid activities that cause symptoms, as told by your health care provider.  Do physical therapy exercises as instructed.  If you lift an object, lift it with your palm facing up. This reduces stress on your elbow. Lifestyle  If your tennis elbow is caused by sports, check your equipment and make sure that: ? You are using it correctly. ? It is the best fit for you.  If your tennis elbow is caused by work or computer use, take frequent breaks to stretch your arm. Talk with your manager about ways to manage your condition at work. If you have a brace:  Wear the brace or strap as told by your health care provider. Remove it only as told by your health care provider.  Loosen the brace if your fingers tingle, become numb, or turn cold and blue.  Keep the brace clean.  If the brace is not waterproof, ask if you may remove it for bathing. If you must keep the brace on while bathing: ? Do not let it get wet. ? Cover it with a watertight covering when you take a   bath or a shower. General instructions   If directed, put ice on the painful area: ? Put ice in a plastic bag. ? Place a towel between your skin and the bag. ? Leave the ice on for 20 minutes, 2-3 times a day.  Take over-the-counter and prescription medicines only as told by your health care provider.  Keep all follow-up visits as told by your health care provider. This is important. Contact a health care provider if:  You have pain that gets worse or does not get better with treatment.   You have numbness or weakness in your forearm, hand, or fingers. Summary  Tennis elbow (lateral epicondylitis) is inflammation of tendons in your outer forearm, near your elbow.  Common symptoms include pain and tenderness in your forearm and the outer part of your elbow.  This condition is usually caused by repeatedly extending your wrist, turning your forearm, and using your hands.  The first treatment is often resting and icing your arm to relieve symptoms. Further treatment may include taking medicine, getting physical therapy, wearing a brace or strap, or having surgery. This information is not intended to replace advice given to you by your health care provider. Make sure you discuss any questions you have with your health care provider. Document Released: 11/10/2005 Document Revised: 08/06/2018 Document Reviewed: 08/25/2017 Elsevier Patient Education  2020 [employer]  

## 2019-06-15 NOTE — Progress Notes (Signed)
Date:  06/15/2019   Name:  David Haley   DOB:  10/15/1956   MRN:  [id number]   Chief Complaint: Elbow Pain (turned elbow over and heard a pop when pouring something out of a cast iron pot. ) and shingles vaccine (second shingles shot)  Arm Pain  The incident occurred more than 1 week ago (5 weeks). The incident occurred at home. Injury mechanism: awkward positioning cast iron pot. The pain is present in the left elbow. The pain does not radiate. The pain is at a severity of 6/10. The pain is moderate. The pain has been fluctuating since the incident. Associated symptoms include tingling. Pertinent negatives include no chest pain, muscle weakness or numbness. The symptoms are aggravated by lifting and movement. He has tried NSAIDs for the symptoms. The treatment provided mild relief.    Review of Systems  Constitutional: Negative for chills and fever.  HENT: Negative for drooling, ear discharge, ear pain and sore throat.   Respiratory: Negative for cough, shortness of breath and wheezing.   Cardiovascular: Negative for chest pain, palpitations and leg swelling.  Gastrointestinal: Negative for abdominal pain, blood in stool, constipation, diarrhea and nausea.  Endocrine: Negative for polydipsia.  Genitourinary: Negative for dysuria, frequency, hematuria and urgency.  Musculoskeletal: Negative for back pain, myalgias and neck pain.  Skin: Negative for rash.  Allergic/Immunologic: Negative for environmental allergies.  Neurological: Positive for tingling. Negative for dizziness, numbness and headaches.  Hematological: Does not bruise/bleed easily.  Psychiatric/Behavioral: Negative for suicidal ideas. The patient is not nervous/anxious.     Patient Active Problem List   Diagnosis Date Noted  . Diabetes mellitus, type 2 (HCC) 08/26/2018  . HTN (hypertension) 03/22/2014    No Known Allergies  Past Surgical History:  Procedure Laterality Date  . ANKLE SURGERY Right 2008   tendon  repair  . APPENDECTOMY  1987  . HERNIA REPAIR  2004   umbilical  . HERNIA REPAIR  2014   mesh  . precancerous mole Left 01/2018   shoulder  . SHOULDER SURGERY Left 1992   arthroscopy  . SQUAMOUS CELL CARCINOMA EXCISION Right 2017   R) arm   . TONSILLECTOMY  1968    Social History   Tobacco Use  . Smoking status: Never Smoker  . Smokeless tobacco: Never Used  Substance Use Topics  . Alcohol use: Yes    Comment: 5-6 /week  . Drug use: Yes    Types: Marijuana     Medication list has been reviewed and updated.  Current Meds  Medication Sig  . aspirin EC 81 MG tablet Take 1 tablet (81 mg total) by mouth daily.  Marland Kitchen. lisinopril-hydrochlorothiazide (ZESTORETIC) 10-12.5 MG tablet Take 1 tablet by mouth daily.  . metFORMIN (GLUCOPHAGE) 1000 MG tablet Take 1 tablet (1,000 mg total) by mouth daily.    PHQ 2/9 Scores 06/15/2019 04/11/2019 08/26/2018  PHQ - 2 Score 0 0 0  PHQ- 9 Score 0 0 0    BP Readings from Last 3 Encounters:  06/15/19 140/62  04/11/19 124/70  10/11/18 130/80    Physical Exam Vitals signs and nursing note reviewed.  HENT:     Head: Normocephalic.     Right Ear: Tympanic membrane, ear canal and external ear normal. There is no impacted cerumen.     Left Ear: Tympanic membrane, ear canal and external ear normal. There is no impacted cerumen.     Nose: Nose normal. No congestion or rhinorrhea.  Eyes:  General: No scleral icterus.       Right eye: No discharge.        Left eye: No discharge.     Conjunctiva/sclera: Conjunctivae normal.     Pupils: Pupils are equal, round, and reactive to light.  Neck:     Musculoskeletal: Normal range of motion and neck supple.     Thyroid: No thyromegaly.     Vascular: No JVD.     Trachea: No tracheal deviation.  Cardiovascular:     Rate and Rhythm: Normal rate and regular rhythm.     Heart sounds: Normal heart sounds. No murmur. No friction rub. No gallop.   Pulmonary:     Effort: No respiratory distress.      Breath sounds: Normal breath sounds. No wheezing, rhonchi or rales.  Chest:     Chest wall: No tenderness.  Abdominal:     General: Bowel sounds are normal.     Palpations: Abdomen is soft. There is no mass.     Tenderness: There is no abdominal tenderness. There is no guarding or rebound.  Musculoskeletal: Normal range of motion.        General: No tenderness.  Lymphadenopathy:     Cervical: No cervical adenopathy.  Skin:    General: Skin is warm.     Capillary Refill: Capillary refill takes less than 2 seconds.     Findings: No rash.  Neurological:     Mental Status: He is alert and oriented to person, place, and time.     Cranial Nerves: No cranial nerve deficit.     Deep Tendon Reflexes: Reflexes are normal and symmetric.     Wt Readings from Last 3 Encounters:  06/15/19 289 lb (131.1 kg)  04/11/19 279 lb (126.6 kg)  10/11/18 278 lb (126.1 kg)    BP 140/62   Pulse 80   Ht 5\' 11"  (1.803 m)   Wt 289 lb (131.1 kg)   BMI 40.31 kg/m   Assessment and Plan:  1. Closed nondisplaced avulsion fracture of lateral epicondyle of left humerus, initial encounter Patient had an episode where he was emptying a cast iron pot of significant weight with his left hand in an awkward position and he felt a pop in the discomfort of his left epicondyle lateral aspect.  This is consistent with a probable evulsion type fracture.  We will obtain an x-ray to look at the elbow to make sure that there is no residual evulsion or fracture.  In the meantime we will continue meloxicam 15 mg once a day and will use a tennis elbow pad strap for reducing tension on the lateral epicondyles.  Information was given to the patient of localized therapy. - DG Elbow Complete Left; Future - Elbow pad  2. Need for shingles vaccine Discussed and administered. - Varicella-zoster vaccine IM (Shingrix)

## 2019-06-16 ENCOUNTER — Other Ambulatory Visit: Payer: Self-pay

## 2019-06-16 DIAGNOSIS — S42435A Nondisplaced fracture (avulsion) of lateral epicondyle of left humerus, initial encounter for closed fracture: Secondary | ICD-10-CM

## 2019-06-17 ENCOUNTER — Ambulatory Visit: Payer: Commercial Managed Care - PPO

## 2019-06-30 ENCOUNTER — Other Ambulatory Visit: Payer: Self-pay

## 2019-08-15 ENCOUNTER — Other Ambulatory Visit: Payer: Self-pay

## 2019-08-15 DIAGNOSIS — Z20822 Contact with and (suspected) exposure to covid-19: Secondary | ICD-10-CM

## 2019-08-16 LAB — NOVEL CORONAVIRUS, NAA: SARS-CoV-2, NAA: NOT DETECTED

## 2019-09-10 IMAGING — CR LEFT ELBOW - COMPLETE 3+ VIEW
4 series · 4 of 4 positions shown · non-contrast
Comparison: None.

CLINICAL DATA: Injury, popping sensation, lateral epicondyle pain

EXAM:
LEFT ELBOW - COMPLETE 3+ VIEW

[elbow ap]
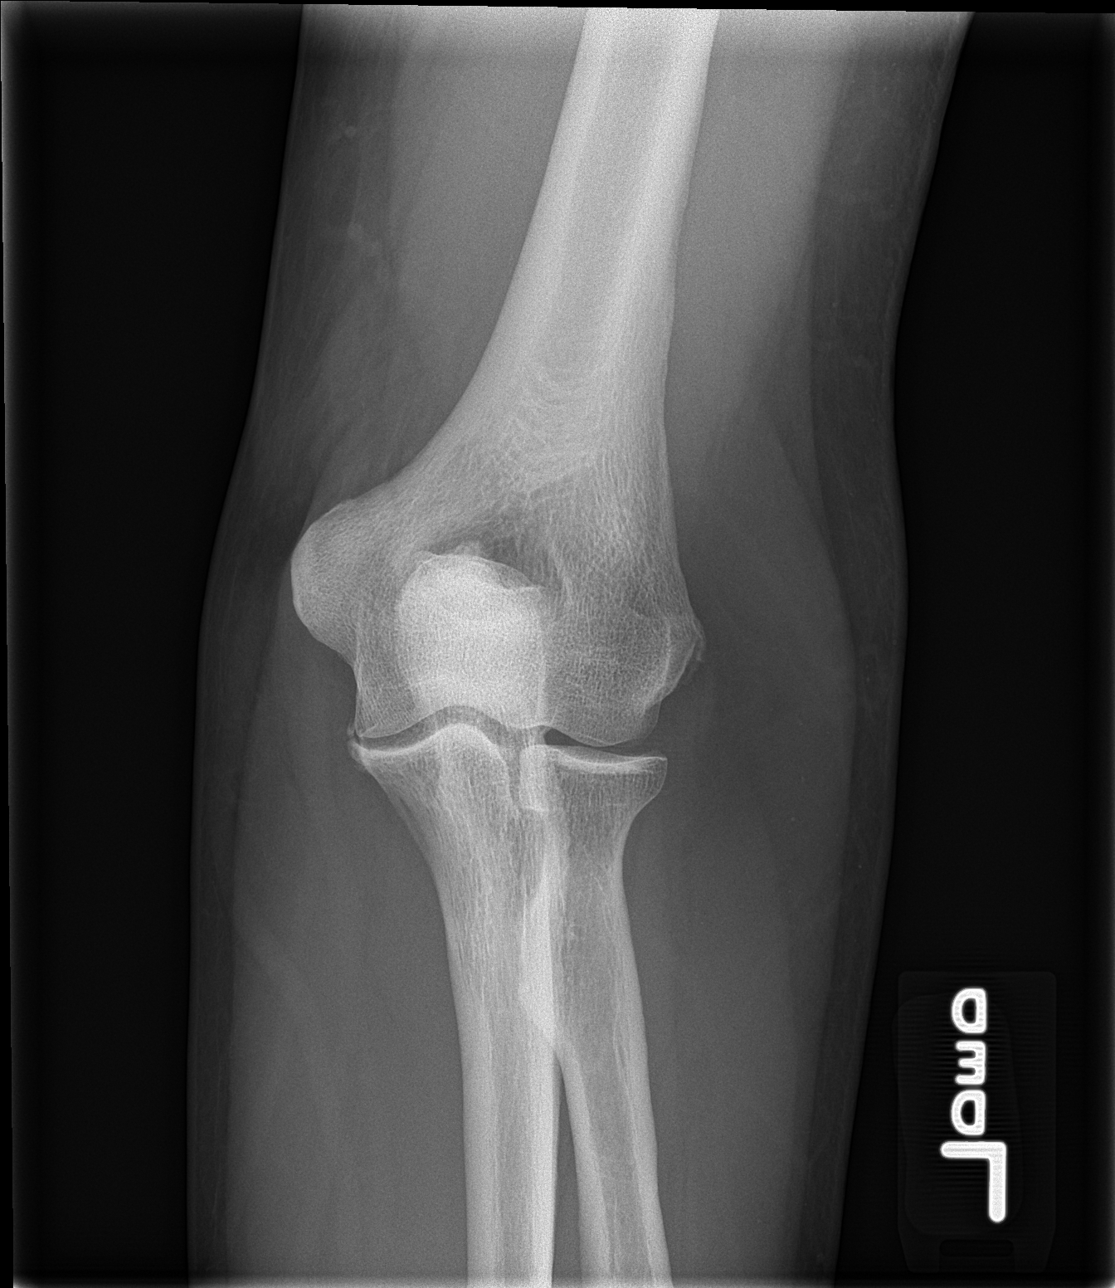

[elbow obl (1 of 2)]
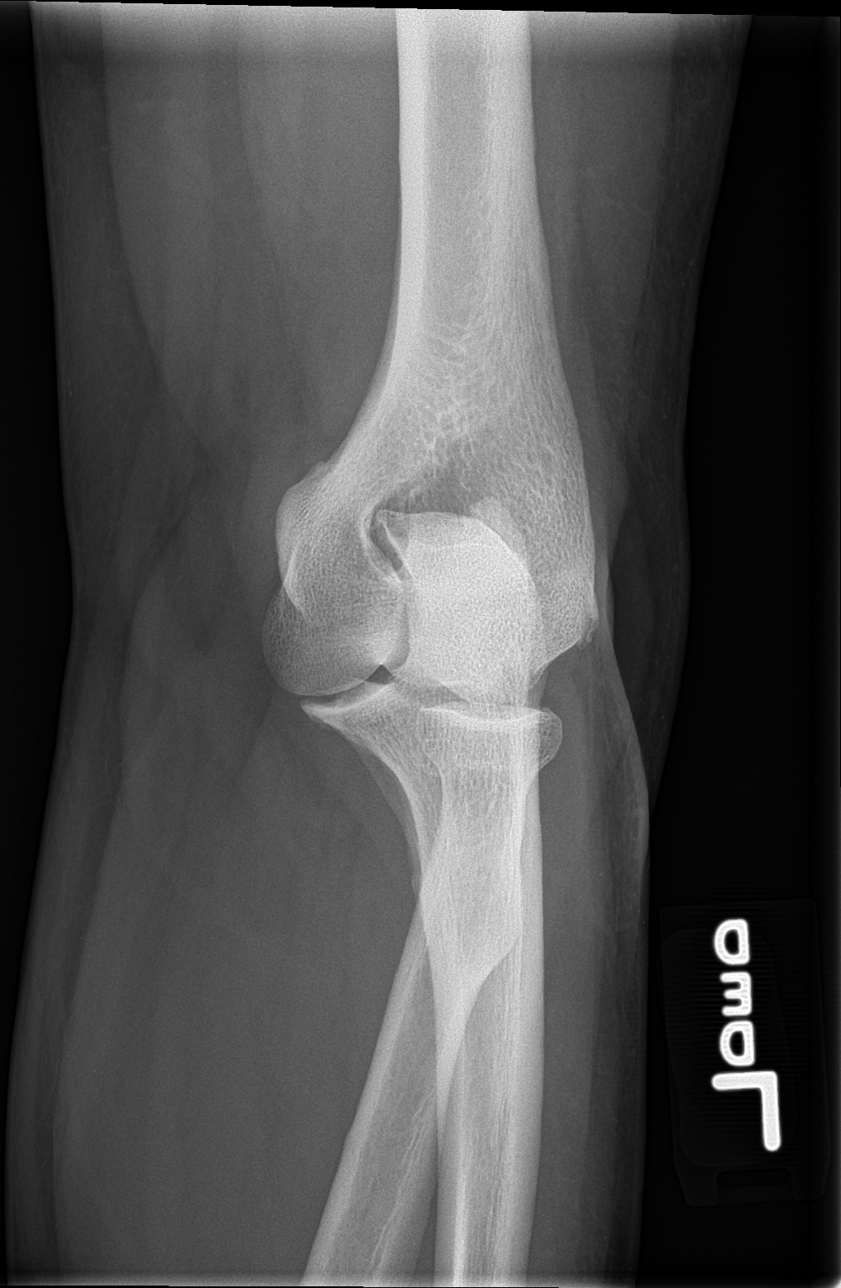

[elbow obl (2 of 2)]
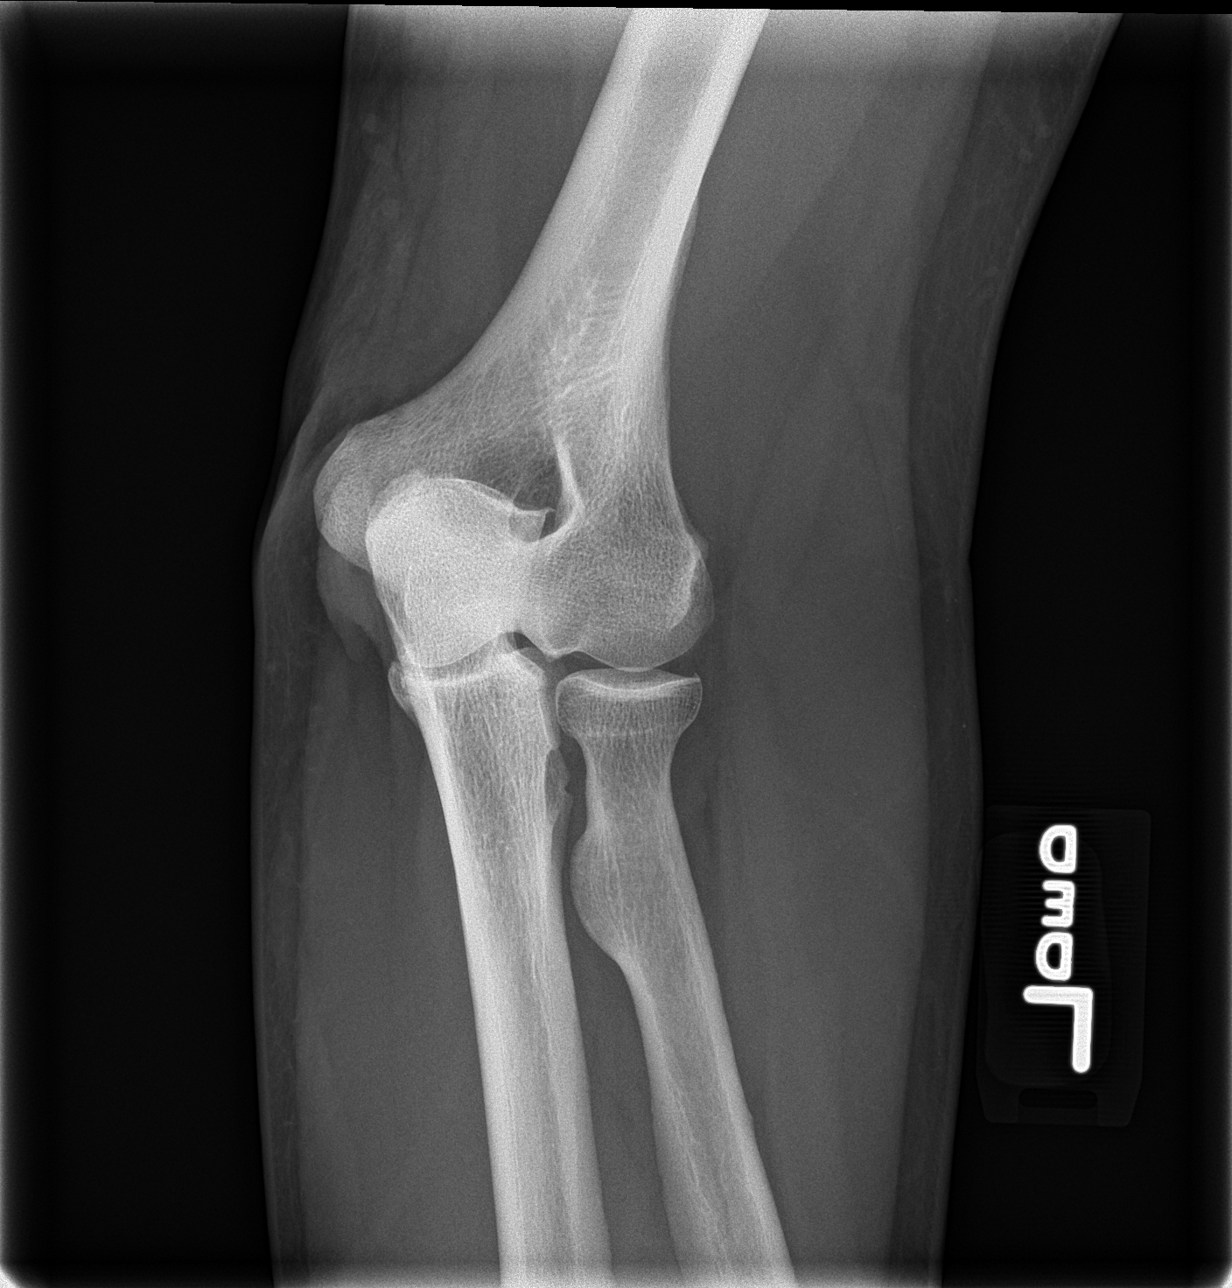

[elbow lat]
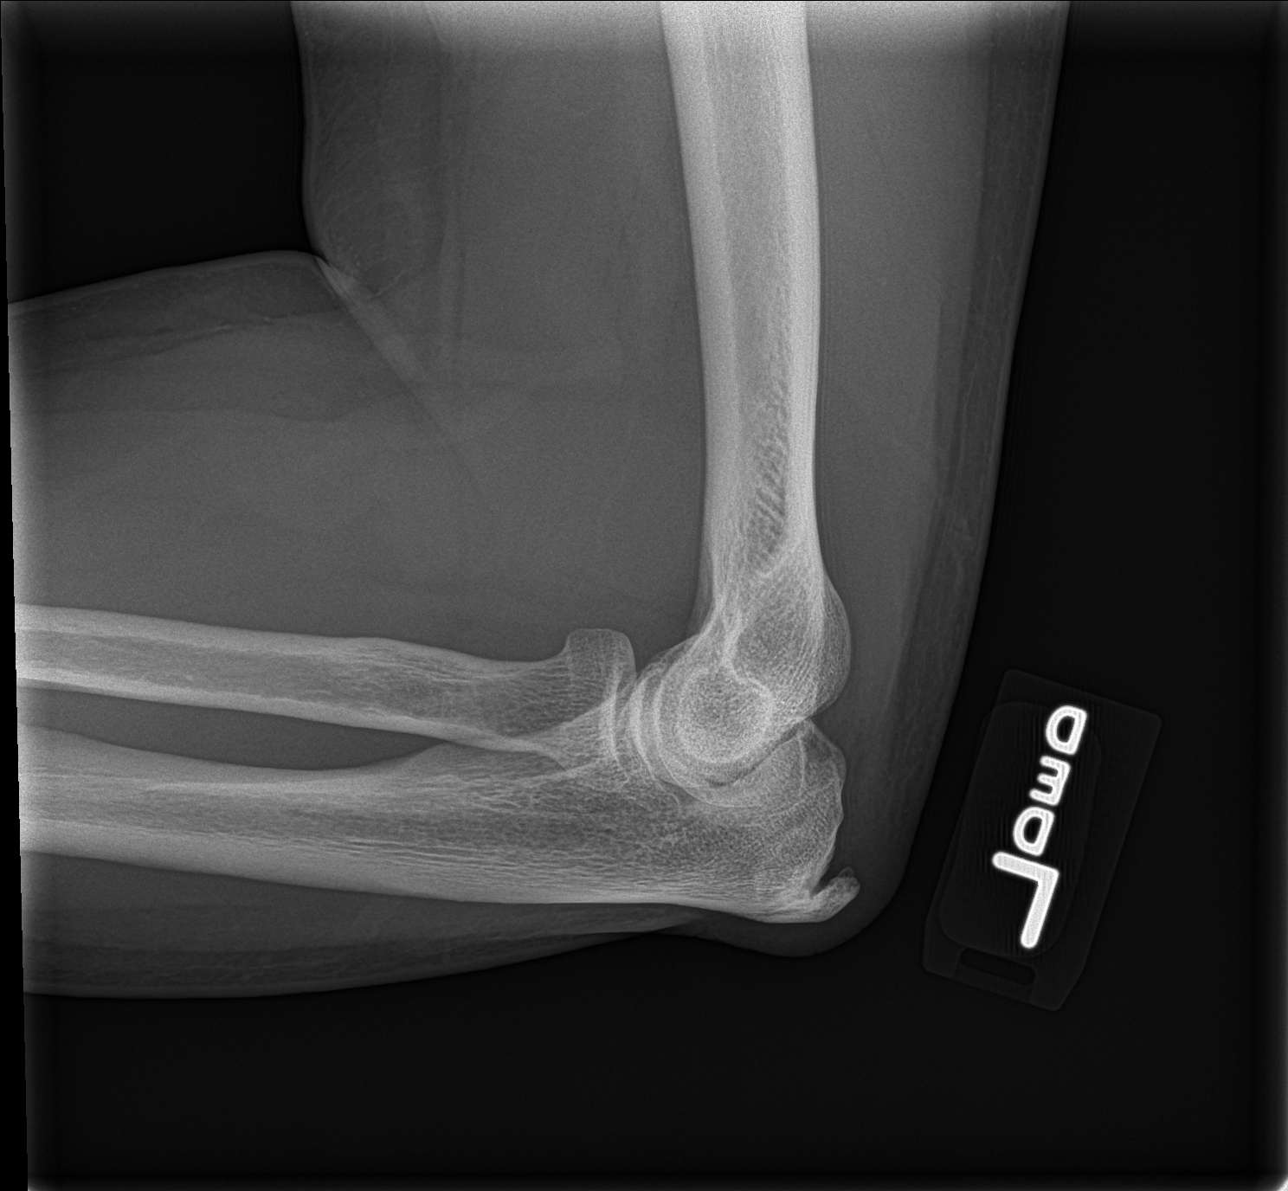

[4 of 4 positions shown; findings below may reference images not displayed]

FINDINGS: No fracture or dislocation of the left elbow. There is minimal elbow
joint arthrosis. No radiographic abnormality of the lateral
epicondyle. There is enthesopathic change of the olecranon. No elbow
joint effusion. Soft tissues are unremarkable.
IMPRESSION: No fracture or dislocation of the left elbow. There is minimal elbow
joint arthrosis. No radiographic abnormality of the lateral
epicondyle. There is enthesopathic change of the olecranon. No elbow
joint effusion. Soft tissues are unremarkable. Consider MRI to more
sensitively evaluate tendinous or ligamentous injury of the elbow,
if suspected.

## 2019-10-12 ENCOUNTER — Other Ambulatory Visit: Payer: Self-pay

## 2019-10-12 ENCOUNTER — Encounter: Payer: Self-pay | Admitting: Family Medicine

## 2019-10-12 ENCOUNTER — Ambulatory Visit (INDEPENDENT_AMBULATORY_CARE_PROVIDER_SITE_OTHER): Payer: Commercial Managed Care - PPO | Admitting: Family Medicine

## 2019-10-12 VITALS — BP 120/70 | HR 86 | Ht 71.0 in | Wt 289.0 lb

## 2019-10-12 DIAGNOSIS — I10 Essential (primary) hypertension: Secondary | ICD-10-CM

## 2019-10-12 DIAGNOSIS — R351 Nocturia: Secondary | ICD-10-CM

## 2019-10-12 DIAGNOSIS — E781 Pure hyperglyceridemia: Secondary | ICD-10-CM

## 2019-10-12 DIAGNOSIS — M7712 Lateral epicondylitis, left elbow: Secondary | ICD-10-CM

## 2019-10-12 DIAGNOSIS — E119 Type 2 diabetes mellitus without complications: Secondary | ICD-10-CM | POA: Diagnosis not present

## 2019-10-12 DIAGNOSIS — Z0000 Encounter for general adult medical examination without abnormal findings: Secondary | ICD-10-CM | POA: Diagnosis not present

## 2019-10-12 DIAGNOSIS — Z6838 Body mass index (BMI) 38.0-38.9, adult: Secondary | ICD-10-CM | POA: Diagnosis not present

## 2019-10-12 MED ORDER — MELOXICAM 15 MG PO TABS: 15.0000 mg | ORAL_TABLET | Freq: Every day | ORAL | 0 refills | Status: DC

## 2019-10-12 MED ORDER — LISINOPRIL-HYDROCHLOROTHIAZIDE 10-12.5 MG PO TABS: 1.0000 | ORAL_TABLET | Freq: Every day | ORAL | 1 refills | Status: DC

## 2019-10-12 MED ORDER — METFORMIN HCL 1000 MG PO TABS: 1000.0000 mg | ORAL_TABLET | Freq: Every day | ORAL | 1 refills | Status: DC

## 2019-10-12 NOTE — Progress Notes (Signed)
Date:  10/12/2019   Name:  David Haley   DOB:  May 25, 1956   MRN:  [id number]   Chief Complaint: Annual Exam, Hypertension, and Diabetes (120-140)  Patient is a 63 year old male who presents for a comprehensive physical exam. The patient reports the following problems: elbow pain. Health maintenance has been reviewed up to date.  Hypertension This is a chronic problem. The current episode started more than 1 year ago. The problem has been waxing and waning since onset. The problem is controlled. Pertinent negatives include no anxiety, blurred vision, chest pain, headaches, malaise/fatigue, neck pain, orthopnea, palpitations, peripheral edema, PND, shortness of breath or sweats. There are no associated agents to hypertension. There are no known risk factors for coronary artery disease. Past treatments include ACE inhibitors and diuretics. The current treatment provides moderate improvement. There are no compliance problems.  There is no history of angina, kidney disease, CAD/MI, CVA, heart failure, left ventricular hypertrophy, PVD or retinopathy. There is no history of chronic renal disease, a hypertension causing med or renovascular disease.  Diabetes He presents for his follow-up diabetic visit. He has type 2 diabetes mellitus. His disease course has been stable. There are no hypoglycemic associated symptoms. Pertinent negatives for hypoglycemia include no dizziness, headaches, nervousness/anxiousness or sweats. Pertinent negatives for diabetes include no blurred vision, no chest pain, no fatigue, no foot paresthesias, no foot ulcerations, no polydipsia, no polyphagia, no polyuria, no visual change, no weakness and no weight loss. There are no hypoglycemic complications. Symptoms are stable. There are no diabetic complications. Pertinent negatives for diabetic complications include no autonomic neuropathy, CVA, heart disease, nephropathy, peripheral neuropathy, PVD or retinopathy. There are no  known risk factors for coronary artery disease. Current diabetic treatment includes oral agent (dual therapy). He is compliant with treatment all of the time. His weight is stable. He is following a generally healthy diet. Meal planning includes carbohydrate counting and avoidance of concentrated sweets. He participates in exercise intermittently. His breakfast blood glucose range is generally 110-130 mg/dl. An ACE inhibitor/angiotensin II receptor blocker is being taken.    Lab Results  Component Value Date   CREATININE 1.03 04/11/2019   BUN 15 04/11/2019   NA 136 04/11/2019   K 4.4 04/11/2019   CL 99 04/11/2019   CO2 20 04/11/2019   Lab Results  Component Value Date   CHOL 162 04/11/2019   HDL 40 04/11/2019   LDLCALC 89 04/11/2019   TRIG 164 (H) 04/11/2019   No results found for: TSH Lab Results  Component Value Date   HGBA1C 6.4 (H) 04/11/2019     Review of Systems  Constitutional: Negative for chills, fatigue, fever, malaise/fatigue and weight loss.  HENT: Negative for drooling, ear discharge, ear pain and sore throat.   Eyes: Negative for blurred vision.  Respiratory: Negative for cough, shortness of breath and wheezing.   Cardiovascular: Negative for chest pain, palpitations, orthopnea, leg swelling and PND.  Gastrointestinal: Negative for abdominal pain, blood in stool, constipation, diarrhea and nausea.  Endocrine: Negative for polydipsia, polyphagia and polyuria.  Genitourinary: Negative for dysuria, frequency, hematuria and urgency.  Musculoskeletal: Negative for back pain, myalgias and neck pain.  Skin: Negative for rash.  Allergic/Immunologic: Negative for environmental allergies.  Neurological: Negative for dizziness, weakness and headaches.  Hematological: Does not bruise/bleed easily.  Psychiatric/Behavioral: Negative for suicidal ideas. The patient is not nervous/anxious.     Patient Active Problem List   Diagnosis Date Noted   Diabetes mellitus, type  2  (HCC) 08/26/2018   HTN (hypertension) 03/22/2014    No Known Allergies  Past Surgical History:  Procedure Laterality Date   ANKLE SURGERY Right 2008   tendon repair   APPENDECTOMY  1987   HERNIA REPAIR  2004   umbilical   HERNIA REPAIR  2014   mesh   precancerous mole Left 01/2018   shoulder   SHOULDER SURGERY Left 1992   arthroscopy   SQUAMOUS CELL CARCINOMA EXCISION Right 2017   R) arm    TONSILLECTOMY  1968    Social History   Tobacco Use   Smoking status: Never Smoker   Smokeless tobacco: Never Used  Substance Use Topics   Alcohol use: Yes    Comment: 5-6 /week   Drug use: Yes    Types: Marijuana     Medication list has been reviewed and updated.  Current Meds  Medication Sig   aspirin EC 81 MG tablet Take 1 tablet (81 mg total) by mouth daily.   lisinopril-hydrochlorothiazide (ZESTORETIC) 10-12.5 MG tablet Take 1 tablet by mouth daily.   metFORMIN (GLUCOPHAGE) 1000 MG tablet Take 1 tablet (1,000 mg total) by mouth daily.    PHQ 2/9 Scores 10/12/2019 06/15/2019 04/11/2019 08/26/2018  PHQ - 2 Score 0 0 0 0  PHQ- 9 Score 0 0 0 0    BP Readings from Last 3 Encounters:  10/12/19 120/70  06/15/19 140/62  04/11/19 124/70    Physical Exam Vitals signs and nursing note reviewed.  Constitutional:      Appearance: He is obese.  HENT:     Head: Normocephalic.     Jaw: There is normal jaw occlusion.     Right Ear: Hearing, tympanic membrane and external ear normal.     Left Ear: Hearing, tympanic membrane and external ear normal.     Nose: Nose normal.     Mouth/Throat:     Lips: Pink.     Mouth: Mucous membranes are moist.     Dentition: Normal dentition.     Tongue: No lesions.     Palate: No mass.     Pharynx: Oropharynx is clear.  Eyes:     General: Lids are normal. Vision grossly intact. Gaze aligned appropriately. No scleral icterus.       Right eye: No discharge.        Left eye: No discharge.     Extraocular Movements:  Extraocular movements intact.     Conjunctiva/sclera: Conjunctivae normal.     Pupils: Pupils are equal, round, and reactive to light.     Funduscopic exam:    Right eye: Red reflex present.        Left eye: Red reflex present. Neck:     Musculoskeletal: Full passive range of motion without pain, normal range of motion and neck supple.     Thyroid: No thyroid mass, thyromegaly or thyroid tenderness.     Vascular: No JVD.     Trachea: No tracheal deviation.  Cardiovascular:     Rate and Rhythm: Normal rate and regular rhythm.     Chest Wall: PMI is not displaced.     Pulses: Normal pulses.          Carotid pulses are 2+ on the right side and 2+ on the left side.      Radial pulses are 2+ on the right side and 2+ on the left side.       Femoral pulses are 2+ on the right side and 2+ on  the left side.      Popliteal pulses are 2+ on the right side and 2+ on the left side.       Dorsalis pedis pulses are 2+ on the right side and 2+ on the left side.       Posterior tibial pulses are 2+ on the right side and 2+ on the left side.     Heart sounds: Normal heart sounds, S1 normal and S2 normal. No murmur. No systolic murmur. No diastolic murmur. No friction rub. No gallop. No S3 or S4 sounds.   Pulmonary:     Effort: Pulmonary effort is normal. No respiratory distress.     Breath sounds: Normal breath sounds. No decreased air movement. No decreased breath sounds, wheezing, rhonchi or rales.  Chest:     Chest wall: No mass.     Breasts:        Right: Normal.        Left: Normal.  Abdominal:     General: Bowel sounds are normal.     Palpations: Abdomen is soft. There is no hepatomegaly, splenomegaly or mass.     Tenderness: There is no abdominal tenderness. There is no guarding or rebound.     Hernia: No hernia is present. There is no hernia in the umbilical area.  Genitourinary:    Scrotum/Testes: Normal.        Right: Mass or tenderness not present.        Left: Mass or tenderness not  present.     Epididymis:     Right: Normal.     Left: Normal.     Prostate: Normal.     Rectum: Normal. Guaiac result negative.  Musculoskeletal: Normal range of motion.     Left elbow: Tenderness found. Lateral epicondyle tenderness noted.     Cervical back: Normal.     Thoracic back: Normal.     Lumbar back: Normal.     Right lower leg: No edema.     Left lower leg: No edema.  Feet:     Right foot:     Protective Sensation: 10 sites tested. 10 sites sensed.     Skin integrity: Callus and dry skin present. No ulcer, blister, skin breakdown, erythema, warmth or fissure.     Toenail Condition: Right toenails are normal.     Left foot:     Protective Sensation: 10 sites tested. 10 sites sensed.     Skin integrity: Callus and dry skin present. No ulcer, blister, skin breakdown, erythema, warmth or fissure.     Toenail Condition: Left toenails are normal.  Lymphadenopathy:     Head:     Right side of head: No submental, submandibular or tonsillar adenopathy.     Left side of head: No submental, submandibular or tonsillar adenopathy.     Cervical: No cervical adenopathy.     Right cervical: No superficial, deep or posterior cervical adenopathy.    Left cervical: No superficial, deep or posterior cervical adenopathy.     Upper Body:     Right upper body: No supraclavicular, axillary or pectoral adenopathy.     Left upper body: No supraclavicular, axillary or pectoral adenopathy.  Skin:    General: Skin is warm.     Capillary Refill: Capillary refill takes less than 2 seconds.     Findings: No rash.  Neurological:     General: No focal deficit present.     Mental Status: He is alert and oriented to person, place, and  time.     Cranial Nerves: Cranial nerves are intact. No cranial nerve deficit.     Sensory: No sensory deficit.     Motor: Motor function is intact.     Deep Tendon Reflexes: Reflexes are normal and symmetric.     Reflex Scores:      Tricep reflexes are 2+ on the  right side and 2+ on the left side.      Bicep reflexes are 2+ on the right side and 2+ on the left side.      Brachioradialis reflexes are 2+ on the right side and 2+ on the left side.      Patellar reflexes are 2+ on the right side and 2+ on the left side.      Achilles reflexes are 2+ on the right side and 2+ on the left side. Psychiatric:        Attention and Perception: Attention normal.        Mood and Affect: Mood and affect normal.        Speech: Speech normal.        Behavior: Behavior normal.        Thought Content: Thought content normal.     Wt Readings from Last 3 Encounters:  10/12/19 289 lb (131.1 kg)  06/15/19 289 lb (131.1 kg)  04/11/19 279 lb (126.6 kg)    BP 120/70    Pulse 86    Ht 5\' 11"  (1.803 m)    Wt 289 lb (131.1 kg)    BMI 40.31 kg/m   Assessment and Plan:  1. Physical exam, annual No subjective/objective concerns noted during history and physical.  Previous encounters labs and imaging reviewed as well as specialty notes.David Haley is a 63 y.o. male who presents today for his Complete Annual Exam. He feels well. He reports exercising . He reports he is sleeping well.Immunizations are reviewed and recommendations provided.   Age appropriate screening tests are discussed. Counseling given for risk factor reduction interventions.   2. Essential hypertension Chronic.  Controlled.  Continue lisinopril hydrochlorothiazide 10-12 0.5 once a day. - lisinopril-hydrochlorothiazide.  Will check a CMP.  (ZESTORETIC) 10-12.5 MG tablet; Take 1 tablet by mouth daily.  Dispense: 90 tablet; Refill: 1  3. BMI 38.0-38.9,adult Health risks of being over weight were discussed and patient was counseled on weight loss options and exercise.  4. Type 2 diabetes mellitus without complication, without long-term current use of insulin (HCC) Chronic.  Controlled.  Continue Metformin 1 g daily.  Will check an A1c as well as a CMP and microalbuminuria for status of diabetes. -  HgB A1c - Comprehensive Metabolic Panel (CMET) - Microalbumin, urine - metFORMIN (GLUCOPHAGE) 1000 MG tablet; Take 1 tablet (1,000 mg total) by mouth daily.  Dispense: 90 tablet; Refill: 1  5. Lateral epicondylitis of left elbow Patient has had a recurrence continuance of his lateral epicondylitis which did not respond to physical therapy.  Nor has it responded to meloxicam we will resume that however for base and we will refer to orthopedics for evaluation may be the possibility of an injection could be considered. - meloxicam (MOBIC) 15 MG tablet; Take 1 tablet (15 mg total) by mouth daily.  Dispense: 30 tablet; Refill: 0 - Ambulatory referral to Orthopedic Surgery  6. Nocturia Patient has minimal nocturia but we will check a PSA nonetheless DRE today was unremarkable for enlargement. - PSA  7. Pure hypertriglyceridemia Chronic.  Controlled.  Will check lipid panel. - Lipid Panel With  LDL/HDL Ratio

## 2019-10-13 LAB — COMPREHENSIVE METABOLIC PANEL
ALT: 38 IU/L (ref 0–44)
AST: 25 IU/L (ref 0–40)
Albumin/Globulin Ratio: 1.4 (ref 1.2–2.2)
Albumin: 4.4 g/dL (ref 3.8–4.8)
Alkaline Phosphatase: 67 IU/L (ref 39–117)
BUN/Creatinine Ratio: 14 (ref 10–24)
BUN: 14 mg/dL (ref 8–27)
Bilirubin Total: 0.4 mg/dL (ref 0.0–1.2)
CO2: 25 mmol/L (ref 20–29)
Calcium: 10 mg/dL (ref 8.6–10.2)
Chloride: 99 mmol/L (ref 96–106)
Creatinine, Ser: 0.99 mg/dL (ref 0.76–1.27)
GFR calc Af Amer: 95 mL/min/{1.73_m2} (ref >59)
GFR calc non Af Amer: 82 mL/min/{1.73_m2} (ref >59)
Globulin, Total: 3.1 g/dL (ref 1.5–4.5)
Glucose: 120 mg/dL — ABNORMAL HIGH (ref 65–99)
Potassium: 4.6 mmol/L (ref 3.5–5.2)
Sodium: 137 mmol/L (ref 134–144)
Total Protein: 7.5 g/dL (ref 6.0–8.5)

## 2019-10-13 LAB — LIPID PANEL WITH LDL/HDL RATIO
Cholesterol, Total: 150 mg/dL (ref 100–199)
HDL: 39 mg/dL — ABNORMAL LOW (ref >39)
LDL Chol Calc (NIH): 85 mg/dL (ref 0–99)
LDL/HDL Ratio: 2.2 ratio (ref 0.0–3.6)
Triglycerides: 145 mg/dL (ref 0–149)
VLDL Cholesterol Cal: 26 mg/dL (ref 5–40)

## 2019-10-13 LAB — HEMOGLOBIN A1C
Est. average glucose Bld gHb Est-mCnc: 148 mg/dL
Hgb A1c MFr Bld: 6.8 % — ABNORMAL HIGH (ref 4.8–5.6)

## 2019-10-13 LAB — MICROALBUMIN, URINE: Microalbumin, Urine: 5.4 ug/mL

## 2019-10-13 LAB — PSA: Prostate Specific Ag, Serum: 0.4 ng/mL (ref 0.0–4.0)

## 2019-11-14 ENCOUNTER — Other Ambulatory Visit: Payer: Self-pay

## 2019-11-14 MED ORDER — ASPIRIN EC 81 MG PO TBEC: 81.0000 mg | DELAYED_RELEASE_TABLET | Freq: Every day | ORAL | 1 refills | Status: DC

## 2019-11-14 NOTE — Progress Notes (Unsigned)
Sent in asp 81mg 

## 2019-12-29 ENCOUNTER — Ambulatory Visit: Payer: Commercial Managed Care - PPO | Admitting: Family Medicine

## 2019-12-29 ENCOUNTER — Encounter: Payer: Self-pay | Admitting: Family Medicine

## 2019-12-29 ENCOUNTER — Other Ambulatory Visit: Payer: Self-pay

## 2019-12-29 VITALS — BP 150/78 | HR 96 | Ht 71.0 in | Wt 289.0 lb

## 2019-12-29 DIAGNOSIS — R05 Cough: Secondary | ICD-10-CM | POA: Diagnosis not present

## 2019-12-29 DIAGNOSIS — I10 Essential (primary) hypertension: Secondary | ICD-10-CM | POA: Diagnosis not present

## 2019-12-29 DIAGNOSIS — T464X5A Adverse effect of angiotensin-converting-enzyme inhibitors, initial encounter: Secondary | ICD-10-CM | POA: Diagnosis not present

## 2019-12-29 MED ORDER — LOSARTAN POTASSIUM-HCTZ 100-25 MG PO TABS: 1.0000 | ORAL_TABLET | Freq: Every day | ORAL | 1 refills | Status: DC

## 2019-12-29 NOTE — Progress Notes (Signed)
cough   Date:  12/29/2019   Name:  David Haley   DOB:  09/01/1956   MRN:  [id number]   Chief Complaint: Hypertension (pt has had elevated readings at ortho and home with a wrist cuff/recheck bp)  Hypertension This is a chronic problem. The current episode started more than 1 year ago. The problem has been waxing and waning since onset. The problem is controlled. Pertinent negatives include no anxiety, blurred vision, chest pain, headaches, malaise/fatigue, neck pain, orthopnea, palpitations, peripheral edema, PND, shortness of breath or sweats. There are no associated agents to hypertension. Past treatments include ACE inhibitors and diuretics. The current treatment provides mild improvement. There are no compliance problems.  There is no history of angina, kidney disease, CAD/MI, CVA, heart failure, left ventricular hypertrophy, PVD or retinopathy. There is no history of chronic renal disease, a hypertension causing med or renovascular disease.    Lab Results  Component Value Date   CREATININE 0.99 10/12/2019   BUN 14 10/12/2019   NA 137 10/12/2019   K 4.6 10/12/2019   CL 99 10/12/2019   CO2 25 10/12/2019   Lab Results  Component Value Date   CHOL 150 10/12/2019   HDL 39 (L) 10/12/2019   LDLCALC 85 10/12/2019   TRIG 145 10/12/2019   No results found for: TSH Lab Results  Component Value Date   HGBA1C 6.8 (H) 10/12/2019     Review of Systems  Constitutional: Negative for chills, fever and malaise/fatigue.  HENT: Negative for drooling, ear discharge, ear pain and sore throat.   Eyes: Negative for blurred vision.  Respiratory: Negative for cough, shortness of breath and wheezing.   Cardiovascular: Negative for chest pain, palpitations, orthopnea, leg swelling and PND.  Gastrointestinal: Negative for abdominal pain, blood in stool, constipation, diarrhea and nausea.  Endocrine: Negative for polydipsia.  Genitourinary: Negative for dysuria, frequency, hematuria and urgency.   Musculoskeletal: Negative for back pain, myalgias and neck pain.  Skin: Negative for rash.  Allergic/Immunologic: Negative for environmental allergies.  Neurological: Negative for dizziness and headaches.  Hematological: Does not bruise/bleed easily.  Psychiatric/Behavioral: Negative for suicidal ideas. The patient is not nervous/anxious.     Patient Active Problem List   Diagnosis Date Noted  . Diabetes mellitus, type 2 (HCC) 08/26/2018  . HTN (hypertension) 03/22/2014    No Known Allergies  Past Surgical History:  Procedure Laterality Date  . ANKLE SURGERY Right 2008   tendon repair  . APPENDECTOMY  1987  . HERNIA REPAIR  2004   umbilical  . HERNIA REPAIR  2014   mesh  . precancerous mole Left 01/2018   shoulder  . SHOULDER SURGERY Left 1992   arthroscopy  . SQUAMOUS CELL CARCINOMA EXCISION Right 2017   R) arm   . TONSILLECTOMY  1968    Social History   Tobacco Use  . Smoking status: Never Smoker  . Smokeless tobacco: Never Used  Substance Use Topics  . Alcohol use: Yes    Comment: 5-6 /week  . Drug use: Yes    Types: Marijuana     Medication list has been reviewed and updated.  Current Meds  Medication Sig  . Ascorbic Acid (VITAMIN C) 1000 MG tablet Take 3,000 mg by mouth daily.  Marland Kitchen aspirin EC 81 MG tablet Take 1 tablet (81 mg total) by mouth daily.  Marland Kitchen lisinopril-hydrochlorothiazide (ZESTORETIC) 10-12.5 MG tablet Take 1 tablet by mouth daily.  . meloxicam (MOBIC) 15 MG tablet Take 1 tablet (15 mg total) by  mouth daily.  . metFORMIN (GLUCOPHAGE) 1000 MG tablet Take 1 tablet (1,000 mg total) by mouth daily.  . Multiple Vitamins-Minerals (CENTRUM MEN) TABS Take 1 tablet by mouth daily.    PHQ 2/9 Scores 10/12/2019 06/15/2019 04/11/2019 08/26/2018  PHQ - 2 Score 0 0 0 0  PHQ- 9 Score 0 0 0 0    BP Readings from Last 3 Encounters:  12/29/19 (!) 150/78  10/12/19 120/70  06/15/19 140/62    Physical Exam Vitals and nursing note reviewed.  HENT:      Head: Normocephalic.     Right Ear: Tympanic membrane, ear canal and external ear normal. There is no impacted cerumen.     Left Ear: Tympanic membrane, ear canal and external ear normal. There is no impacted cerumen.     Nose: Nose normal. No congestion or rhinorrhea.     Mouth/Throat:     Mouth: Mucous membranes are moist.     Pharynx: No oropharyngeal exudate or posterior oropharyngeal erythema.  Eyes:     General: No scleral icterus.       Right eye: No discharge.        Left eye: No discharge.     Conjunctiva/sclera: Conjunctivae normal.     Pupils: Pupils are equal, round, and reactive to light.  Neck:     Thyroid: No thyromegaly.     Vascular: No JVD.     Trachea: No tracheal deviation.  Cardiovascular:     Rate and Rhythm: Normal rate and regular rhythm.     Heart sounds: Normal heart sounds. No murmur. No friction rub. No gallop.   Pulmonary:     Effort: No respiratory distress.     Breath sounds: Normal breath sounds. No wheezing or rales.  Abdominal:     General: Bowel sounds are normal.     Palpations: Abdomen is soft. There is no mass.     Tenderness: There is no abdominal tenderness. There is no right CVA tenderness, left CVA tenderness, guarding or rebound.  Musculoskeletal:        General: No tenderness. Normal range of motion.     Cervical back: Normal range of motion and neck supple.  Lymphadenopathy:     Cervical: No cervical adenopathy.  Skin:    General: Skin is warm.     Findings: No rash.  Neurological:     Mental Status: He is alert and oriented to person, place, and time.     Cranial Nerves: No cranial nerve deficit.     Deep Tendon Reflexes: Reflexes are normal and symmetric.     Wt Readings from Last 3 Encounters:  12/29/19 289 lb (131.1 kg)  10/12/19 289 lb (131.1 kg)  06/15/19 289 lb (131.1 kg)    BP (!) 150/78   Pulse 96   Ht 5\' 11"  (1.803 m)   Wt 289 lb (131.1 kg)   BMI 40.31 kg/m   Assessment and Plan:  1. Essential hypertension  Chronic.  Uncontrolled.  Stable.  Patient is not having any issues with chest pain or near syncopal concerns.  We will increase patient on his blood pressure medication and currently will take to losartan-hydrochlorothiazide 100/25 once a day.  Will recheck in 6 weeks. - losartan-hydrochlorothiazide (HYZAAR) 100-25 MG tablet; Take 1 tablet by mouth daily.  Dispense: 90 tablet; Refill: 1  2. Cough due to ACE inhibitor Questioning patient was noted to have a cough on the ACE inhibitor lisinopril and we decided to switch over to losartan and go  receptor blocker at 100/25 which is the equivalent of lisinopril 20/25 we will recheck in 6 weeks.

## 2019-12-29 NOTE — Patient Instructions (Addendum)
How to Take Your Blood Pressure You can take your blood pressure at home with a machine. You may need to check your blood pressure at home:  To check if you have high blood pressure (hypertension).  To check your blood pressure over time.  To make sure your blood pressure medicine is working. Supplies needed: You will need a blood pressure machine, or monitor. You can buy one at a drugstore or online. When choosing one:  Choose one with an arm cuff.  Choose one that wraps around your upper arm. Only one finger should fit between your arm and the cuff.  Do not choose one that measures your blood pressure from your wrist or finger. Your doctor can suggest a monitor. How to prepare Avoid these things for 30 minutes before checking your blood pressure:  Drinking caffeine.  Drinking alcohol.  Eating.  Smoking.  Exercising. Five minutes before checking your blood pressure:  Pee.  Sit in a dining chair. Avoid sitting in a soft couch or armchair.  Be quiet. Do not talk. How to take your blood pressure Follow the instructions that came with your machine. If you have a digital blood pressure monitor, these may be the instructions: 1. Sit up straight. 2. Place your feet on the floor. Do not cross your ankles or legs. 3. Rest your left arm at the level of your heart. You may rest it on a table, desk, or chair. 4. Pull up your shirt sleeve. 5. Wrap the blood pressure cuff around the upper part of your left arm. The cuff should be 1 inch (2.5 cm) above your elbow. It is best to wrap the cuff around bare skin. 6. Fit the cuff snugly around your arm. You should be able to place only one finger between the cuff and your arm. 7. Put the cord inside the groove of your elbow. 8. Press the power button. 9. Sit quietly while the cuff fills with air and loses air. 10. Write down the numbers on the screen. 11. Wait 2-3 minutes and then repeat steps 1-10. What do the numbers mean? Two  numbers make up your blood pressure. The first number is called systolic pressure. The second is called diastolic pressure. An example of a blood pressure reading is "120 over 80" (or 120/80). If you are an adult and do not have a medical condition, use this guide to find out if your blood pressure is normal: Normal  First number: below 120.  Second number: below 80. Elevated  First number: 120-129.  Second number: below 80. Hypertension stage 1  First number: 130-139.  Second number: 80-89. Hypertension stage 2  First number: 140 or above.  Second number: 90 or above. Your blood pressure is above normal even if only the top or bottom number is above normal. Follow these instructions at home:  Check your blood pressure as often as your doctor tells you to.  Take your monitor to your next doctor's appointment. Your doctor will: ? Make sure you are using it correctly. ? Make sure it is working right.  Make sure you understand what your blood pressure numbers should be.  Tell your doctor if your medicines are causing side effects. Contact a doctor if:  Your blood pressure keeps being high. Get help right away if:  Your first blood pressure number is higher than 180.  Your second blood pressure number is higher than 120. This information is not intended to replace advice given to you by your health   care provider. Make sure you discuss any questions you have with your health care provider. Document Revised: 10/23/2017 Document Reviewed: 04/18/2016 Elsevier Patient Education  2020 [employer]  DASH Eating Plan DASH stands for "Dietary Approaches to Stop Hypertension." The DASH eating plan is a healthy eating plan that has been shown to reduce high blood pressure (hypertension). It may also reduce your risk for type 2 diabetes, heart disease, and stroke. The DASH eating plan may also help with weight loss. What are tips for following this plan?  General guidelines Avoid  eating more than 2,300 mg (milligrams) of salt (sodium) a day. If you have hypertension, you may need to reduce your sodium intake to 1,500 mg a day. Limit alcohol intake to no more than 1 drink a day for nonpregnant women and 2 drinks a day for men. One drink equals 12 oz of beer, 5 oz of wine, or 1 oz of hard liquor. Work with your health care provider to maintain a healthy body weight or to lose weight. Ask what an ideal weight is for you. Get at least 30 minutes of exercise that causes your heart to beat faster (aerobic exercise) most days of the week. Activities may include walking, swimming, or biking. Work with your health care provider or diet and nutrition specialist (dietitian) to adjust your eating plan to your individual calorie needs. Reading food labels  Check food labels for the amount of sodium per serving. Choose foods with less than 5 percent of the Daily Value of sodium. Generally, foods with less than 300 mg of sodium per serving fit into this eating plan. To find whole grains, look for the word "whole" as the first word in the ingredient list. Shopping Buy products labeled as "low-sodium" or "no salt added." Buy fresh foods. Avoid canned foods and premade or frozen meals. Cooking Avoid adding salt when cooking. Use salt-free seasonings or herbs instead of table salt or sea salt. Check with your health care provider or pharmacist before using salt substitutes. Do not fry foods. Cook foods using healthy methods such as baking, boiling, grilling, and broiling instead. Cook with heart-healthy oils, such as olive, canola, soybean, or sunflower oil. Meal planning Eat a balanced diet that includes: 5 or more servings of fruits and vegetables each day. At each meal, try to fill half of your plate with fruits and vegetables. Up to 6-8 servings of whole grains each day. Less than 6 oz of lean meat, poultry, or fish each day. A 3-oz serving of meat is about the same size as a deck of  cards. One egg equals 1 oz. 2 servings of low-fat dairy each day. A serving of nuts, seeds, or beans 5 times each week. Heart-healthy fats. Healthy fats called Omega-3 fatty acids are found in foods such as flaxseeds and coldwater fish, like sardines, salmon, and mackerel. Limit how much you eat of the following: Canned or prepackaged foods. Food that is high in trans fat, such as fried foods. Food that is high in saturated fat, such as fatty meat. Sweets, desserts, sugary drinks, and other foods with added sugar. Full-fat dairy products. Do not salt foods before eating. Try to eat at least 2 vegetarian meals each week. Eat more home-cooked food and less restaurant, buffet, and fast food. When eating at a restaurant, ask that your food be prepared with less salt or no salt, if possible. What foods are recommended? The items listed may not be a complete list. Talk with your dietitian  about what dietary choices are best for you. Grains Whole-grain or whole-wheat bread. Whole-grain or whole-wheat pasta. Brown rice. [place]. Bulgur. Whole-grain and low-sodium cereals. Pita bread. Low-fat, low-sodium crackers. Whole-wheat flour tortillas. Vegetables Fresh or frozen vegetables (raw, steamed, roasted, or grilled). Low-sodium or reduced-sodium tomato and vegetable juice. Low-sodium or reduced-sodium tomato sauce and tomato paste. Low-sodium or reduced-sodium canned vegetables. Fruits All fresh, dried, or frozen fruit. Canned fruit in natural juice (without added sugar). Meat and other protein foods Skinless chicken or [place]. Ground chicken or [place]. Pork with fat trimmed off. Fish and seafood. Egg whites. Dried beans, peas, or lentils. Unsalted nuts, nut butters, and seeds. Unsalted canned beans. Lean cuts of beef with fat trimmed off. Low-sodium, lean deli meat. Dairy Low-fat (1%) or fat-free (skim) milk. Fat-free, low-fat, or reduced-fat cheeses. Nonfat, low-sodium ricotta or cottage  cheese. Low-fat or nonfat yogurt. Low-fat, low-sodium cheese. Fats and oils Soft margarine without trans fats. Vegetable oil. Low-fat, reduced-fat, or light mayonnaise and salad dressings (reduced-sodium). Canola, safflower, olive, soybean, and sunflower oils. Avocado. Seasoning and other foods Herbs. Spices. Seasoning mixes without salt. Unsalted popcorn and pretzels. Fat-free sweets. What foods are not recommended? The items listed may not be a complete list. Talk with your dietitian about what dietary choices are best for you. Grains Baked goods made with fat, such as croissants, muffins, or some breads. Dry pasta or rice meal packs. Vegetables Creamed or fried vegetables. Vegetables in a cheese sauce. Regular canned vegetables (not low-sodium or reduced-sodium). Regular canned tomato sauce and paste (not low-sodium or reduced-sodium). Regular tomato and vegetable juice (not low-sodium or reduced-sodium). Angie Fava. Olives. Fruits Canned fruit in a light or heavy syrup. Fried fruit. Fruit in cream or butter sauce. Meat and other protein foods Fatty cuts of meat. Ribs. Fried meat. Berniece Salines. Sausage. Bologna and other processed lunch meats. Salami. Fatback. Hotdogs. Bratwurst. Salted nuts and seeds. Canned beans with added salt. Canned or smoked fish. Whole eggs or egg yolks. Chicken or [place] with skin. Dairy Whole or 2% milk, cream, and half-and-half. Whole or full-fat cream cheese. Whole-fat or sweetened yogurt. Full-fat cheese. Nondairy creamers. Whipped toppings. Processed cheese and cheese spreads. Fats and oils Butter. Stick margarine. Lard. Shortening. Ghee. Bacon fat. Tropical oils, such as coconut, palm kernel, or palm oil. Seasoning and other foods Salted popcorn and pretzels. Onion salt, garlic salt, seasoned salt, table salt, and sea salt. Worcestershire sauce. Tartar sauce. Barbecue sauce. Teriyaki sauce. Soy sauce, including reduced-sodium. Steak sauce. Canned and packaged gravies.  Fish sauce. Oyster sauce. Cocktail sauce. Horseradish that you find on the shelf. Ketchup. Mustard. Meat flavorings and tenderizers. Bouillon cubes. Hot sauce and Tabasco sauce. Premade or packaged marinades. Premade or packaged taco seasonings. Relishes. Regular salad dressings. Where to find more information: National Heart, Lung, and [place]: https://wilson-eaton.com/ American Heart Association: www.heart.org Summary The DASH eating plan is a healthy eating plan that has been shown to reduce high blood pressure (hypertension). It may also reduce your risk for type 2 diabetes, heart disease, and stroke. With the DASH eating plan, you should limit salt (sodium) intake to 2,300 mg a day. If you have hypertension, you may need to reduce your sodium intake to 1,500 mg a day. When on the DASH eating plan, aim to eat more fresh fruits and vegetables, whole grains, lean proteins, low-fat dairy, and heart-healthy fats. Work with your health care provider or diet and nutrition specialist (dietitian) to adjust your eating plan to your individual calorie needs. This information  is not intended to replace advice given to you by your health care provider. Make sure you discuss any questions you have with your health care provider. Document Revised: 10/23/2017 Document Reviewed: 11/03/2016 Elsevier Patient Education  2020 [employer].  Hypertension, Adult High blood pressure (hypertension) is when the force of blood pumping through the arteries is too strong. The arteries are the blood vessels that carry blood from the heart throughout the body. Hypertension forces the heart to work harder to pump blood and may cause arteries to become narrow or stiff. Untreated or uncontrolled hypertension can cause a heart attack, heart failure, a stroke, kidney disease, and other problems. A blood pressure reading consists of a higher number over a lower number. Ideally, your blood pressure should be below 120/80. The first  ("top") number is called the systolic pressure. It is a measure of the pressure in your arteries as your heart beats. The second ("bottom") number is called the diastolic pressure. It is a measure of the pressure in your arteries as the heart relaxes. What are the causes? The exact cause of this condition is not known. There are some conditions that result in or are related to high blood pressure. What increases the risk? Some risk factors for high blood pressure are under your control. The following factors may make you more likely to develop this condition:  Smoking.  Having type 2 diabetes mellitus, high cholesterol, or both.  Not getting enough exercise or physical activity.  Being overweight.  Having too much fat, sugar, calories, or salt (sodium) in your diet.  Drinking too much alcohol. Some risk factors for high blood pressure may be difficult or impossible to change. Some of these factors include:  Having chronic kidney disease.  Having a family history of high blood pressure.  Age. Risk increases with age.  Race. You may be at higher risk if you are African American.  Gender. Men are at higher risk than women before age 33. After age 31, women are at higher risk than men.  Having obstructive sleep apnea.  Stress. What are the signs or symptoms? High blood pressure may not cause symptoms. Very high blood pressure (hypertensive crisis) may cause:  Headache.  Anxiety.  Shortness of breath.  Nosebleed.  Nausea and vomiting.  Vision changes.  Severe chest pain.  Seizures. How is this diagnosed? This condition is diagnosed by measuring your blood pressure while you are seated, with your arm resting on a flat surface, your legs uncrossed, and your feet flat on the floor. The cuff of the blood pressure monitor will be placed directly against the skin of your upper arm at the level of your heart. It should be measured at least twice using the same arm. Certain  conditions can cause a difference in blood pressure between your right and left arms. Certain factors can cause blood pressure readings to be lower or higher than normal for a short period of time:  When your blood pressure is higher when you are in a health care provider's office than when you are at home, this is called white coat hypertension. Most people with this condition do not need medicines.  When your blood pressure is higher at home than when you are in a health care provider's office, this is called masked hypertension. Most people with this condition may need medicines to control blood pressure. If you have a high blood pressure reading during one visit or you have normal blood pressure with other risk factors,  you may be asked to:  Return on a different day to have your blood pressure checked again.  Monitor your blood pressure at home for 1 week or longer. If you are diagnosed with hypertension, you may have other blood or imaging tests to help your health care provider understand your overall risk for other conditions. How is this treated? This condition is treated by making healthy lifestyle changes, such as eating healthy foods, exercising more, and reducing your alcohol intake. Your health care provider may prescribe medicine if lifestyle changes are not enough to get your blood pressure under control, and if:  Your systolic blood pressure is above 130.  Your diastolic blood pressure is above 80. Your personal target blood pressure may vary depending on your medical conditions, your age, and other factors. Follow these instructions at home: Eating and drinking   Eat a diet that is high in fiber and potassium, and low in sodium, added sugar, and fat. An example eating plan is called the DASH (Dietary Approaches to Stop Hypertension) diet. To eat this way: ? Eat plenty of fresh fruits and vegetables. Try to fill one half of your plate at each meal with fruits and  vegetables. ? Eat whole grains, such as whole-wheat pasta, brown rice, or whole-grain bread. Fill about one fourth of your plate with whole grains. ? Eat or drink low-fat dairy products, such as skim milk or low-fat yogurt. ? Avoid fatty cuts of meat, processed or cured meats, and poultry with skin. Fill about one fourth of your plate with lean proteins, such as fish, chicken without skin, beans, eggs, or tofu. ? Avoid pre-made and processed foods. These tend to be higher in sodium, added sugar, and fat.  Reduce your daily sodium intake. Most people with hypertension should eat less than 1,500 mg of sodium a day.  Do not drink alcohol if: ? Your health care provider tells you not to drink. ? You are pregnant, may be pregnant, or are planning to become pregnant.  If you drink alcohol: ? Limit how much you use to:  0-1 drink a day for women.  0-2 drinks a day for men. ? Be aware of how much alcohol is in your drink. In the [place] one drink equals one 12 oz bottle of beer (355 mL), one 5 oz glass of wine (148 mL), or one 1 oz glass of hard liquor (44 mL). Lifestyle   Work with your health care provider to maintain a healthy body weight or to lose weight. Ask what an ideal weight is for you.  Get at least 30 minutes of exercise most days of the week. Activities may include walking, swimming, or biking.  Include exercise to strengthen your muscles (resistance exercise), such as Pilates or lifting weights, as part of your weekly exercise routine. Try to do these types of exercises for 30 minutes at least 3 days a week.  Do not use any products that contain nicotine or tobacco, such as cigarettes, e-cigarettes, and chewing tobacco. If you need help quitting, ask your health care provider.  Monitor your blood pressure at home as told by your health care provider.  Keep all follow-up visits as told by your health care provider. This is important. Medicines  Take over-the-counter and  prescription medicines only as told by your health care provider. Follow directions carefully. Blood pressure medicines must be taken as prescribed.  Do not skip doses of blood pressure medicine. Doing this puts you at risk for problems and  can make the medicine less effective.  Ask your health care provider about side effects or reactions to medicines that you should watch for. Contact a health care provider if you:  Think you are having a reaction to a medicine you are taking.  Have headaches that keep coming back (recurring).  Feel dizzy.  Have swelling in your ankles.  Have trouble with your vision. Get help right away if you:  Develop a severe headache or confusion.  Have unusual weakness or numbness.  Feel faint.  Have severe pain in your chest or abdomen.  Vomit repeatedly.  Have trouble breathing. Summary  Hypertension is when the force of blood pumping through your arteries is too strong. If this condition is not controlled, it may put you at risk for serious complications.  Your personal target blood pressure may vary depending on your medical conditions, your age, and other factors. For most people, a normal blood pressure is less than 120/80.  Hypertension is treated with lifestyle changes, medicines, or a combination of both. Lifestyle changes include losing weight, eating a healthy, low-sodium diet, exercising more, and limiting alcohol. This information is not intended to replace advice given to you by your health care provider. Make sure you discuss any questions you have with your health care provider. Document Revised: 07/21/2018 Document Reviewed: 07/21/2018 Elsevier Patient Education  2020 [employer].

## 2020-01-13 DIAGNOSIS — M7712 Lateral epicondylitis, left elbow: Secondary | ICD-10-CM | POA: Insufficient documentation

## 2020-02-09 ENCOUNTER — Other Ambulatory Visit: Payer: Self-pay

## 2020-02-09 ENCOUNTER — Encounter: Payer: Self-pay | Admitting: Family Medicine

## 2020-02-09 ENCOUNTER — Ambulatory Visit: Payer: Commercial Managed Care - PPO | Admitting: Family Medicine

## 2020-02-09 DIAGNOSIS — I10 Essential (primary) hypertension: Secondary | ICD-10-CM | POA: Diagnosis not present

## 2020-02-09 MED ORDER — LOSARTAN POTASSIUM-HCTZ 100-25 MG PO TABS: 1.0000 | ORAL_TABLET | Freq: Every day | ORAL | 1 refills | Status: DC

## 2020-02-09 NOTE — Progress Notes (Signed)
Date:  02/09/2020   Name:  David Haley   DOB:  [birth year]   MRN:  [id number]   Chief Complaint: Follow-up (to high b/p and cough due to ace inhibitor- recheck potassium level)  Hypertension This is a chronic problem. The current episode started more than 1 year ago. The problem has been gradually improving since onset. The problem is controlled. Pertinent negatives include no anxiety, blurred vision, chest pain, headaches, malaise/fatigue, neck pain, orthopnea, palpitations, peripheral edema, PND, shortness of breath or sweats. There are no associated agents to hypertension. There are no known risk factors for coronary artery disease. Past treatments include angiotensin blockers. The current treatment provides mild improvement. There are no compliance problems.  There is no history of angina, kidney disease, CAD/MI, CVA, heart failure, left ventricular hypertrophy, PVD or retinopathy. There is no history of chronic renal disease, a hypertension causing med or renovascular disease.    Lab Results  Component Value Date   CREATININE 0.99 10/12/2019   BUN 14 10/12/2019   NA 137 10/12/2019   K 4.6 10/12/2019   CL 99 10/12/2019   CO2 25 10/12/2019   Lab Results  Component Value Date   CHOL 150 10/12/2019   HDL 39 (L) 10/12/2019   LDLCALC 85 10/12/2019   TRIG 145 10/12/2019   No results found for: TSH Lab Results  Component Value Date   HGBA1C 6.8 (H) 10/12/2019   Lab Results  Component Value Date   WBC 9.5 12/22/2013   HGB 14.8 12/22/2013   HCT 44.4 12/22/2013   MCV 92 12/22/2013   PLT 244 12/22/2013   Lab Results  Component Value Date   ALT 38 10/12/2019   AST 25 10/12/2019   ALKPHOS 67 10/12/2019   BILITOT 0.4 10/12/2019     Review of Systems  Constitutional: Negative for chills, fever and malaise/fatigue.  HENT: Negative for drooling, ear discharge, ear pain and sore throat.   Eyes: Negative for blurred vision.  Respiratory: Positive for cough. Negative for  shortness of breath and wheezing.        Improved ARB  Cardiovascular: Negative for chest pain, palpitations, orthopnea, leg swelling and PND.  Gastrointestinal: Negative for abdominal pain, blood in stool, constipation, diarrhea and nausea.  Endocrine: Negative for polydipsia.  Genitourinary: Negative for dysuria, frequency, hematuria and urgency.  Musculoskeletal: Negative for back pain, myalgias and neck pain.  Skin: Negative for rash.  Allergic/Immunologic: Negative for environmental allergies.  Neurological: Negative for dizziness and headaches.  Hematological: Does not bruise/bleed easily.  Psychiatric/Behavioral: Negative for suicidal ideas. The patient is not nervous/anxious.     Patient Active Problem List   Diagnosis Date Noted  . Diabetes mellitus, type 2 ([place]) 08/26/2018  . HTN (hypertension) 03/22/2014    No Known Allergies  Past Surgical History:  Procedure Laterality Date  . ANKLE SURGERY Right 2008   tendon repair  . APPENDECTOMY  1987  . HERNIA REPAIR  5462   umbilical  . HERNIA REPAIR  2014   mesh  . precancerous mole Left 01/2018   shoulder  . SHOULDER SURGERY Left 1992   arthroscopy  . SQUAMOUS CELL CARCINOMA EXCISION Right 2017   R) arm   . TONSILLECTOMY  1968    Social History   Tobacco Use  . Smoking status: Never Smoker  . Smokeless tobacco: Never Used  Substance Use Topics  . Alcohol use: Yes    Comment: 5-6 /week  . Drug use: Yes    Types: Marijuana  Medication list has been reviewed and updated.  Current Meds  Medication Sig  . Ascorbic Acid (VITAMIN C) 1000 MG tablet Take 3,000 mg by mouth daily.  Marland Kitchen aspirin EC 81 MG tablet Take 1 tablet (81 mg total) by mouth daily.  Marland Kitchen losartan-hydrochlorothiazide (HYZAAR) 100-25 MG tablet Take 1 tablet by mouth daily.  . meloxicam (MOBIC) 15 MG tablet Take 1 tablet (15 mg total) by mouth daily.  . metFORMIN (GLUCOPHAGE) 1000 MG tablet Take 1 tablet (1,000 mg total) by mouth daily.  .  Multiple Vitamins-Minerals (CENTRUM MEN) TABS Take 1 tablet by mouth daily.    PHQ 2/9 Scores 02/09/2020 10/12/2019 06/15/2019 04/11/2019  PHQ - 2 Score 0 0 0 0  PHQ- 9 Score 0 0 0 0    BP Readings from Last 3 Encounters:  02/09/20 130/78  12/29/19 (!) 150/78  10/12/19 120/70    Physical Exam Vitals and nursing note reviewed.  HENT:     Head: Normocephalic.     Right Ear: Tympanic membrane, ear canal and external ear normal.     Left Ear: Tympanic membrane, ear canal and external ear normal.     Nose: Nose normal. No congestion or rhinorrhea.     Mouth/Throat:     Mouth: Mucous membranes are moist.     Pharynx: Oropharynx is clear.  Eyes:     General: No scleral icterus.       Right eye: No discharge.        Left eye: No discharge.     Conjunctiva/sclera: Conjunctivae normal.     Pupils: Pupils are equal, round, and reactive to light.  Neck:     Thyroid: No thyromegaly.     Vascular: No JVD.     Trachea: No tracheal deviation.  Cardiovascular:     Rate and Rhythm: Normal rate and regular rhythm.     Heart sounds: Normal heart sounds. No murmur. No friction rub. No gallop.   Pulmonary:     Effort: No respiratory distress.     Breath sounds: Normal breath sounds. No wheezing, rhonchi or rales.  Abdominal:     General: Bowel sounds are normal.     Palpations: Abdomen is soft. There is no mass.     Tenderness: There is no abdominal tenderness. There is no guarding or rebound.  Musculoskeletal:        General: No tenderness. Normal range of motion.     Cervical back: Normal range of motion and neck supple.  Lymphadenopathy:     Cervical: No cervical adenopathy.  Skin:    General: Skin is warm.     Capillary Refill: Capillary refill takes less than 2 seconds.     Findings: No rash.  Neurological:     Mental Status: He is alert and oriented to person, place, and time.     Cranial Nerves: No cranial nerve deficit.     Deep Tendon Reflexes: Reflexes are normal and  symmetric.     Wt Readings from Last 3 Encounters:  02/09/20 295 lb (133.8 kg)  12/29/19 289 lb (131.1 kg)  10/12/19 289 lb (131.1 kg)    BP 130/78   Pulse 72   Ht 5\' 11"  (1.803 m)   Wt 295 lb (133.8 kg)   BMI 41.14 kg/m   Assessment and Plan: 1. Essential hypertension Chronic.  Controlled.  Stable.  Patient had a cough on lisinopril 20-25.  After stopping lisinopril and replacing with losartan hydrochlorothiazide 100-25 patient may still have a cough but  is not nearly as bad as prior.  So we will continue this there was mild elevation of potassium and patient will be coming in May for his physical and we will recheck lab work at that time.  In the meantime we will continue losartan hydrochlorothiazide 100-25 once a day. - losartan-hydrochlorothiazide (HYZAAR) 100-25 MG tablet; Take 1 tablet by mouth daily.  Dispense: 90 tablet; Refill: 1

## 2020-02-13 ENCOUNTER — Ambulatory Visit: Payer: Commercial Managed Care - PPO | Attending: Internal Medicine

## 2020-02-13 DIAGNOSIS — Z23 Encounter for immunization: Secondary | ICD-10-CM

## 2020-02-13 NOTE — Progress Notes (Signed)
   Covid-19 Vaccination Clinic  Name:  Wendelin Bradt Dittrich    MRN: [id number] DOB: 05-25-1956  02/13/2020  Mr. Guidice was observed post Covid-19 immunization for 15 minutes without incident. He was provided with Vaccine Information Sheet and instruction to access the V-Safe system.   Mr. Molyneux was instructed to call 911 with any severe reactions post vaccine: Marland Kitchen Difficulty breathing  . Swelling of face and throat  . A fast heartbeat  . A bad rash all over body  . Dizziness and weakness   Immunizations Administered    Name Date Dose VIS Date Route   Pfizer COVID-19 Vaccine 02/13/2020 12:03 PM 0.3 mL 11/04/2019 Intramuscular   Manufacturer: [employer], [employer]   Lot: [id number]   NDC: [address]-1

## 2020-03-07 ENCOUNTER — Ambulatory Visit: Payer: Commercial Managed Care - PPO | Attending: Internal Medicine

## 2020-03-07 DIAGNOSIS — Z23 Encounter for immunization: Secondary | ICD-10-CM

## 2020-03-07 NOTE — Progress Notes (Signed)
   Covid-19 Vaccination Clinic  Name:  David Haley    MRN: [id number] DOB: 07-Aug-1956  03/07/2020  [name] was observed post Covid-19 immunization for 15 minutes without incident. He was provided with Vaccine Information Sheet and instruction to access the V-Safe system.   [name] was instructed to call 911 with any severe reactions post vaccine: Marland Kitchen Difficulty breathing  . Swelling of face and throat  . A fast heartbeat  . A bad rash all over body  . Dizziness and weakness   Immunizations Administered    Name Date Dose VIS Date Route   Pfizer COVID-19 Vaccine 03/07/2020  2:02 PM 0.3 mL 11/04/2019 Intramuscular   Manufacturer: [employer], [employer]   Lot: [id number]   NDC: [address]-6

## 2020-04-10 ENCOUNTER — Other Ambulatory Visit: Payer: Self-pay

## 2020-04-10 ENCOUNTER — Ambulatory Visit: Payer: Commercial Managed Care - PPO | Admitting: Family Medicine

## 2020-04-10 ENCOUNTER — Encounter: Payer: Self-pay | Admitting: Family Medicine

## 2020-04-10 VITALS — BP 138/86 | HR 80 | Ht 71.0 in | Wt 297.0 lb

## 2020-04-10 DIAGNOSIS — E119 Type 2 diabetes mellitus without complications: Secondary | ICD-10-CM

## 2020-04-10 DIAGNOSIS — I10 Essential (primary) hypertension: Secondary | ICD-10-CM

## 2020-04-10 DIAGNOSIS — L578 Other skin changes due to chronic exposure to nonionizing radiation: Secondary | ICD-10-CM

## 2020-04-10 DIAGNOSIS — M26609 Unspecified temporomandibular joint disorder, unspecified side: Secondary | ICD-10-CM | POA: Diagnosis not present

## 2020-04-10 DIAGNOSIS — Z6841 Body Mass Index (BMI) 40.0 and over, adult: Secondary | ICD-10-CM

## 2020-04-10 DIAGNOSIS — R5383 Other fatigue: Secondary | ICD-10-CM

## 2020-04-10 MED ORDER — ASPIRIN EC 81 MG PO TBEC: 81.0000 mg | DELAYED_RELEASE_TABLET | Freq: Every day | ORAL | 5 refills | Status: DC

## 2020-04-10 MED ORDER — ASPIRIN EC 81 MG PO TBEC: 81.0000 mg | DELAYED_RELEASE_TABLET | Freq: Every day | ORAL | 3 refills | Status: DC

## 2020-04-10 MED ORDER — METFORMIN HCL ER 750 MG PO TB24: 750.0000 mg | ORAL_TABLET | Freq: Every day | ORAL | 1 refills | Status: DC

## 2020-04-10 MED ORDER — LOSARTAN POTASSIUM-HCTZ 100-25 MG PO TABS: 1.0000 | ORAL_TABLET | Freq: Every day | ORAL | 1 refills | Status: DC

## 2020-04-10 NOTE — Progress Notes (Signed)
Date:  04/10/2020   Name:  David Haley   DOB:  [birth year]   MRN:  [id number]   Chief Complaint: Hypertension and Diabetes (150-170)  Hypertension This is a chronic problem. The current episode started more than 1 year ago. The problem has been waxing and waning since onset. The problem is uncontrolled. Pertinent negatives include no anxiety, blurred vision, chest pain, headaches, malaise/fatigue, neck pain, orthopnea, palpitations, peripheral edema, PND, shortness of breath or sweats. There are no associated agents to hypertension. Risk factors for coronary artery disease include diabetes mellitus. Past treatments include angiotensin blockers and diuretics. The current treatment provides moderate improvement. There are no compliance problems.  There is no history of angina, kidney disease, CAD/MI, CVA, heart failure, left ventricular hypertrophy, PVD or retinopathy. There is no history of chronic renal disease, a hypertension causing med or renovascular disease.  Diabetes He presents for his follow-up diabetic visit. He has type 2 diabetes mellitus. His disease course has been stable. Pertinent negatives for hypoglycemia include no confusion, dizziness, headaches, hunger, nervousness/anxiousness, pallor or sweats. Pertinent negatives for diabetes include no blurred vision, no chest pain, no fatigue, no foot paresthesias, no foot ulcerations, no polydipsia, no polyphagia, no polyuria, no visual change, no weakness and no weight loss. There are no hypoglycemic complications. There are no diabetic complications. Pertinent negatives for diabetic complications include no CVA, PVD or retinopathy. There are no known risk factors for coronary artery disease. Current diabetic treatment includes oral agent (monotherapy). He is compliant with treatment most of the time. He is following a generally healthy diet. Meal planning includes avoidance of concentrated sweets and carbohydrate counting. He participates  in exercise daily. His breakfast blood glucose range is generally 140-180 mg/dl. An ACE inhibitor/angiotensin II receptor blocker is being taken. Eye exam is not current.    Lab Results  Component Value Date   CREATININE 0.99 10/12/2019   BUN 14 10/12/2019   NA 137 10/12/2019   K 4.6 10/12/2019   CL 99 10/12/2019   CO2 25 10/12/2019   Lab Results  Component Value Date   CHOL 150 10/12/2019   HDL 39 (L) 10/12/2019   LDLCALC 85 10/12/2019   TRIG 145 10/12/2019   No results found for: TSH Lab Results  Component Value Date   HGBA1C 6.8 (H) 10/12/2019   Lab Results  Component Value Date   WBC 9.5 12/22/2013   HGB 14.8 12/22/2013   HCT 44.4 12/22/2013   MCV 92 12/22/2013   PLT 244 12/22/2013   Lab Results  Component Value Date   ALT 38 10/12/2019   AST 25 10/12/2019   ALKPHOS 67 10/12/2019   BILITOT 0.4 10/12/2019     Review of Systems  Constitutional: Negative for chills, fatigue, fever, malaise/fatigue and weight loss.  HENT: Negative for drooling, ear discharge, ear pain and sore throat.   Eyes: Negative for blurred vision.  Respiratory: Negative for cough, shortness of breath and wheezing.   Cardiovascular: Negative for chest pain, palpitations, orthopnea, leg swelling and PND.  Gastrointestinal: Negative for abdominal pain, blood in stool, constipation, diarrhea and nausea.  Endocrine: Negative for polydipsia, polyphagia and polyuria.  Genitourinary: Negative for dysuria, frequency, hematuria and urgency.  Musculoskeletal: Negative for back pain, myalgias and neck pain.  Skin: Negative for pallor and rash.  Allergic/Immunologic: Negative for environmental allergies.  Neurological: Negative for dizziness, weakness and headaches.  Hematological: Does not bruise/bleed easily.  Psychiatric/Behavioral: Negative for confusion and suicidal ideas. The patient is not nervous/anxious.  Patient Active Problem List   Diagnosis Date Noted  . Diabetes mellitus, type 2  ([place]) 08/26/2018  . HTN (hypertension) 03/22/2014    No Known Allergies  Past Surgical History:  Procedure Laterality Date  . ANKLE SURGERY Right 2008   tendon repair  . APPENDECTOMY  1987  . HERNIA REPAIR  4010   umbilical  . HERNIA REPAIR  2014   mesh  . precancerous mole Left 01/2018   shoulder  . SHOULDER SURGERY Left 1992   arthroscopy  . SQUAMOUS CELL CARCINOMA EXCISION Right 2017   R) arm   . TONSILLECTOMY  1968    Social History   Tobacco Use  . Smoking status: Never Smoker  . Smokeless tobacco: Never Used  Substance Use Topics  . Alcohol use: Yes    Comment: 5-6 /week  . Drug use: Yes    Types: Marijuana     Medication list has been reviewed and updated.  Current Meds  Medication Sig  . Ascorbic Acid (VITAMIN C) 1000 MG tablet Take 3,000 mg by mouth daily.  Marland Kitchen aspirin EC 81 MG tablet Take 1 tablet (81 mg total) by mouth daily.  Marland Kitchen losartan-hydrochlorothiazide (HYZAAR) 100-25 MG tablet Take 1 tablet by mouth daily.  . metFORMIN (GLUCOPHAGE) 1000 MG tablet Take 1 tablet (1,000 mg total) by mouth daily.  . Multiple Vitamins-Minerals (CENTRUM MEN) TABS Take 1 tablet by mouth daily.    PHQ 2/9 Scores 04/10/2020 02/09/2020 10/12/2019 06/15/2019  PHQ - 2 Score 0 0 0 0  PHQ- 9 Score 0 0 0 0    BP Readings from Last 3 Encounters:  04/10/20 (!) 140/98  02/09/20 130/78  12/29/19 (!) 150/78    Physical Exam Vitals and nursing note reviewed.  HENT:     Head: Normocephalic.     Jaw: There is normal jaw occlusion. Tenderness present. No malocclusion.     Right Ear: Tympanic membrane, ear canal and external ear normal.     Left Ear: Tympanic membrane, ear canal and external ear normal.     Nose: Nose normal. No congestion or rhinorrhea.     Mouth/Throat:     Mouth: Mucous membranes are moist.  Eyes:     General: No scleral icterus.       Right eye: No discharge.        Left eye: No discharge.     Conjunctiva/sclera: Conjunctivae normal.     Pupils: Pupils  are equal, round, and reactive to light.  Neck:     Thyroid: No thyromegaly.     Vascular: No JVD.     Trachea: No tracheal deviation.  Cardiovascular:     Rate and Rhythm: Normal rate and regular rhythm.     Heart sounds: Normal heart sounds. No murmur. No friction rub. No gallop.   Pulmonary:     Effort: No respiratory distress.     Breath sounds: Normal breath sounds. No stridor. No wheezing, rhonchi or rales.  Chest:     Chest wall: No tenderness.  Abdominal:     General: Bowel sounds are normal.     Palpations: Abdomen is soft. There is no mass.     Tenderness: There is no abdominal tenderness. There is no guarding or rebound.  Musculoskeletal:        General: No tenderness. Normal range of motion.     Cervical back: Normal range of motion and neck supple.  Lymphadenopathy:     Cervical: No cervical adenopathy.  Skin:  General: Skin is warm.     Capillary Refill: Capillary refill takes less than 2 seconds.     Findings: No rash.  Neurological:     Mental Status: He is alert and oriented to person, place, and time.     Cranial Nerves: No cranial nerve deficit.     Deep Tendon Reflexes: Reflexes are normal and symmetric.     Wt Readings from Last 3 Encounters:  04/10/20 297 lb (134.7 kg)  02/09/20 295 lb (133.8 kg)  12/29/19 289 lb (131.1 kg)    BP (!) 140/98   Pulse 80   Ht 5\' 11"  (1.803 m)   Wt 297 lb (134.7 kg)   BMI 41.42 kg/m   Assessment and Plan: 1. Type 2 diabetes mellitus without complication, without long-term current use of insulin (HCC) Chronic.  Uncontrolled.  Stable.  Patient has not been taking his Metformin on a twice a day basis because of difficulty remembering to take the second dose so we will change to an XR form of Metformin 750 mg once a day to see if this improves compliance and improve his blood sugar control.  We will check renal function panel lipid panel as well as A1c at this time to assess control. - metFORMIN (GLUCOPHAGE-XR) 750 MG  24 hr tablet; Take 1 tablet (750 mg total) by mouth daily with breakfast.  Dispense: 90 tablet; Refill: 1 - Renal Function Panel - Lipid Panel With LDL/HDL Ratio - Hemoglobin A1c  2. Essential hypertension Chronic.  Controlled.  Relatively stable.  Recheck of blood pressure is in acceptable range.  We will continue losartan hydrochlorothiazide.  Strongly encouraged weight loss.  Will check renal function panel to assess electrolytes and GFR. - losartan-hydrochlorothiazide (HYZAAR) 100-25 MG tablet; Take 1 tablet by mouth daily.  Dispense: 90 tablet; Refill: 1 - Renal Function Panel  3. Class 3 severe obesity due to excess calories without serious comorbidity with body mass index (BMI) of 40.0 to 44.9 in adult [facility]) Chronic.  Uncontrolled.  With 18 pound weight gain over the past year.  Will check TSH, testosterone, and lipid panel at this time.  Patient has been given a weight loss diet. - TSH - Testosterone,Free and Total - Lipid Panel With LDL/HDL Ratio  4. TMJ (temporomandibular joint disorder) New onset.  Tenderness over the left TMJ.  Patient particularly notes that after he wakes up at night which may suggest that he is grinding his teeth at night as well.  Have suggested may be a trial of a bite guard.  Patient is unable to take anti-inflammatories at this time we will have encouraged that the patient takes Tylenol as needed for pain.  5. Fatigue, unspecified type Chronic.  Controlled.  Will check TSH and testosterone as patient has inquired about testosterone being an issue. - TSH - Testosterone,Free and Total  6. Actinic skin damage Recently noted that there is a scaly area which seems to come back after removal by patient.  This is consistent with an actinic skin damage and patient will contact [facility]. [facility] his dermatologist for appointment to address this.

## 2020-04-10 NOTE — Patient Instructions (Signed)

## 2020-04-15 LAB — RENAL FUNCTION PANEL
Albumin: 4.4 g/dL (ref 3.8–4.8)
BUN/Creatinine Ratio: 14 (ref 10–24)
BUN: 15 mg/dL (ref 8–27)
CO2: 21 mmol/L (ref 20–29)
Calcium: 9.6 mg/dL (ref 8.6–10.2)
Chloride: 100 mmol/L (ref 96–106)
Creatinine, Ser: 1.05 mg/dL (ref 0.76–1.27)
GFR calc Af Amer: 88 mL/min/{1.73_m2} (ref >59)
GFR calc non Af Amer: 76 mL/min/{1.73_m2} (ref >59)
Glucose: 170 mg/dL — ABNORMAL HIGH (ref 65–99)
Phosphorus: 2.8 mg/dL (ref 2.8–4.1)
Potassium: 4.3 mmol/L (ref 3.5–5.2)
Sodium: 139 mmol/L (ref 134–144)

## 2020-04-15 LAB — LIPID PANEL WITH LDL/HDL RATIO
Cholesterol, Total: 159 mg/dL (ref 100–199)
HDL: 39 mg/dL — ABNORMAL LOW (ref >39)
LDL Chol Calc (NIH): 88 mg/dL (ref 0–99)
LDL/HDL Ratio: 2.3 ratio (ref 0.0–3.6)
Triglycerides: 188 mg/dL — ABNORMAL HIGH (ref 0–149)
VLDL Cholesterol Cal: 32 mg/dL (ref 5–40)

## 2020-04-15 LAB — HEMOGLOBIN A1C
Est. average glucose Bld gHb Est-mCnc: 160 mg/dL
Hgb A1c MFr Bld: 7.2 % — ABNORMAL HIGH (ref 4.8–5.6)

## 2020-04-15 LAB — TESTOSTERONE,FREE AND TOTAL
Testosterone, Free: 6 pg/mL — ABNORMAL LOW (ref 6.6–18.1)
Testosterone: 269 ng/dL (ref 264–916)

## 2020-04-15 LAB — TSH: TSH: 4.13 u[IU]/mL (ref 0.450–4.500)

## 2020-05-29 DIAGNOSIS — U071 COVID-19: Secondary | ICD-10-CM

## 2020-05-29 HISTORY — DX: COVID-19: U07.1

## 2020-05-30 ENCOUNTER — Telehealth: Payer: Self-pay | Admitting: *Deleted

## 2020-05-30 NOTE — Telephone Encounter (Signed)
Patient tested positive for Covid-19 yesterday, rapid test in [place] after exposure at work last week. Has congestion and occasional cough only. Reviewed isolation precautions for the next 10 days. Monitor your breathing for wheezing/SOB with any changes seek treatment immediately. Increase water intake to maintain hydration. Has already notified others he has been around over the last 5 days. If no fever and no worsening respiratory sxs for several consecutive days at the end of the 10 day period you may discontinue isolation. May use OTC medications for any symptoms as needed. He has appointment for PCR testing today.Will notify [facility] Department at this time.

## 2020-07-25 ENCOUNTER — Other Ambulatory Visit: Payer: Self-pay

## 2020-07-25 ENCOUNTER — Ambulatory Visit: Payer: Commercial Managed Care - PPO | Admitting: Family Medicine

## 2020-07-25 ENCOUNTER — Other Ambulatory Visit
Admission: RE | Admit: 2020-07-25 | Discharge: 2020-07-25 | Disposition: A | Discharge status: Home / Self Care | Payer: Commercial Managed Care - PPO | Attending: Family Medicine | Admitting: Family Medicine

## 2020-07-25 ENCOUNTER — Encounter: Payer: Self-pay | Admitting: Family Medicine

## 2020-07-25 VITALS — BP 120/80 | HR 84 | Ht 71.0 in | Wt 279.0 lb

## 2020-07-25 DIAGNOSIS — E785 Hyperlipidemia, unspecified: Secondary | ICD-10-CM

## 2020-07-25 DIAGNOSIS — E119 Type 2 diabetes mellitus without complications: Secondary | ICD-10-CM | POA: Insufficient documentation

## 2020-07-25 DIAGNOSIS — I10 Essential (primary) hypertension: Secondary | ICD-10-CM

## 2020-07-25 LAB — LIPID PANEL
Cholesterol: 165 mg/dL (ref 0–200)
HDL: 41 mg/dL (ref >40)
LDL Cholesterol: 89 mg/dL (ref 0–99)
Total CHOL/HDL Ratio: 4 RATIO
Triglycerides: 173 mg/dL — ABNORMAL HIGH (ref <150)
VLDL: 35 mg/dL (ref 0–40)

## 2020-07-25 LAB — HEMOGLOBIN A1C
Hgb A1c MFr Bld: 6.4 % — ABNORMAL HIGH (ref 4.8–5.6)
Mean Plasma Glucose: 136.98 mg/dL

## 2020-07-25 MED ORDER — LOSARTAN POTASSIUM-HCTZ 50-12.5 MG PO TABS: 1.0000 | ORAL_TABLET | Freq: Every day | ORAL | 3 refills | Status: DC

## 2020-07-25 NOTE — Progress Notes (Signed)
Date:  07/25/2020   Name:  David Haley   DOB:  [birth year]   MRN:  [id number]   Chief Complaint: Diabetes (recheck on diabetes- 140s)  Diabetes He presents for his follow-up diabetic visit. He has type 2 diabetes mellitus. His disease course has been stable. There are no hypoglycemic associated symptoms. Pertinent negatives for hypoglycemia include no dizziness, headaches or nervousness/anxiousness. Associated symptoms include weight loss. Pertinent negatives for diabetes include no blurred vision, no chest pain, no fatigue, no foot paresthesias, no foot ulcerations, no polydipsia, no polyphagia, no polyuria, no visual change and no weakness. There are no hypoglycemic complications. Symptoms are improving. There are no diabetic complications. Risk factors for coronary artery disease include dyslipidemia. Current diabetic treatment includes oral agent (monotherapy). He is compliant with treatment all of the time. His weight is decreasing steadily. He is following a generally healthy diet. Meal planning includes avoidance of concentrated sweets and carbohydrate counting. His breakfast blood glucose is taken between 8-9 am. His breakfast blood glucose range is generally 130-140 mg/dl.  Hyperlipidemia This is a chronic problem. The current episode started more than 1 year ago. The problem is controlled. Recent lipid tests were reviewed and are normal. Exacerbating diseases include obesity. Factors aggravating his hyperlipidemia include thiazides. Pertinent negatives include no chest pain, focal sensory loss, focal weakness, leg pain, myalgias or shortness of breath. The current treatment provides mild improvement of lipids. There are no compliance problems.  Risk factors for coronary artery disease include dyslipidemia, male sex and obesity.    Lab Results  Component Value Date   CREATININE 1.05 04/10/2020   BUN 15 04/10/2020   NA 139 04/10/2020   K 4.3 04/10/2020   CL 100 04/10/2020   CO2 21  04/10/2020   Lab Results  Component Value Date   CHOL 159 04/10/2020   HDL 39 (L) 04/10/2020   LDLCALC 88 04/10/2020   TRIG 188 (H) 04/10/2020   Lab Results  Component Value Date   TSH 4.130 04/10/2020   Lab Results  Component Value Date   HGBA1C 7.2 (H) 04/10/2020   Lab Results  Component Value Date   WBC 9.5 12/22/2013   HGB 14.8 12/22/2013   HCT 44.4 12/22/2013   MCV 92 12/22/2013   PLT 244 12/22/2013   Lab Results  Component Value Date   ALT 38 10/12/2019   AST 25 10/12/2019   ALKPHOS 67 10/12/2019   BILITOT 0.4 10/12/2019     Review of Systems  Constitutional: Positive for weight loss. Negative for chills, fatigue and fever.  HENT: Negative for drooling, ear discharge, ear pain, postnasal drip and sore throat.   Eyes: Negative for blurred vision.  Respiratory: Negative for cough, shortness of breath and wheezing.   Cardiovascular: Negative for chest pain, palpitations and leg swelling.  Gastrointestinal: Negative for abdominal pain, blood in stool, constipation, diarrhea and nausea.  Endocrine: Negative for polydipsia, polyphagia and polyuria.  Genitourinary: Negative for dysuria, frequency, hematuria and urgency.  Musculoskeletal: Negative for back pain, myalgias and neck pain.  Skin: Negative for rash.  Allergic/Immunologic: Negative for environmental allergies.  Neurological: Negative for dizziness, focal weakness, weakness and headaches.  Hematological: Does not bruise/bleed easily.  Psychiatric/Behavioral: Negative for suicidal ideas. The patient is not nervous/anxious.     Patient Active Problem List   Diagnosis Date Noted  . Diabetes mellitus, type 2 (HCC) 08/26/2018  . HTN (hypertension) 03/22/2014    No Known Allergies  Past Surgical History:  Procedure Laterality Date  .  ANKLE SURGERY Right 2008   tendon repair  . APPENDECTOMY  1987  . HERNIA REPAIR  2004   umbilical  . HERNIA REPAIR  2014   mesh  . precancerous mole Left 01/2018    shoulder  . SHOULDER SURGERY Left 1992   arthroscopy  . SQUAMOUS CELL CARCINOMA EXCISION Right 2017   R) arm   . TONSILLECTOMY  1968    Social History   Tobacco Use  . Smoking status: Never Smoker  . Smokeless tobacco: Never Used  Substance Use Topics  . Alcohol use: Yes    Comment: 5-6 /week  . Drug use: Yes    Types: Marijuana     Medication list has been reviewed and updated.  Current Meds  Medication Sig  . Ascorbic Acid (VITAMIN C) 1000 MG tablet Take 3,000 mg by mouth daily.  Marland Kitchen aspirin EC 81 MG tablet Take 1 tablet (81 mg total) by mouth daily.  Marland Kitchen losartan-hydrochlorothiazide (HYZAAR) 100-25 MG tablet Take 1 tablet by mouth daily.  . metFORMIN (GLUCOPHAGE-XR) 750 MG 24 hr tablet Take 1 tablet (750 mg total) by mouth daily with breakfast.  . Multiple Vitamins-Minerals (CENTRUM MEN) TABS Take 1 tablet by mouth daily.    PHQ 2/9 Scores 07/25/2020 04/10/2020 02/09/2020 10/12/2019  PHQ - 2 Score 0 0 0 0  PHQ- 9 Score 0 0 0 0    GAD 7 : Generalized Anxiety Score 07/25/2020 02/09/2020 10/12/2019  Nervous, Anxious, on Edge 0 0 0  Control/stop worrying 0 0 0  Worry too much - different things 0 0 0  Trouble relaxing 0 0 0  Restless 0 0 0  Easily annoyed or irritable 0 0 0  Afraid - awful might happen 0 0 0  Total GAD 7 Score 0 0 0    BP Readings from Last 3 Encounters:  07/25/20 120/80  04/10/20 138/86  02/09/20 130/78    Physical Exam Vitals and nursing note reviewed.  HENT:     Head: Normocephalic.     Right Ear: Tympanic membrane, ear canal and external ear normal.     Left Ear: Tympanic membrane, ear canal and external ear normal.     Nose: Nose normal.  Eyes:     General: No scleral icterus.       Right eye: No discharge.        Left eye: No discharge.     Conjunctiva/sclera: Conjunctivae normal.     Pupils: Pupils are equal, round, and reactive to light.  Neck:     Thyroid: No thyromegaly.     Vascular: No JVD.     Trachea: No tracheal deviation.    Cardiovascular:     Rate and Rhythm: Normal rate and regular rhythm.     Heart sounds: Normal heart sounds. No murmur heard.  No friction rub. No gallop.   Pulmonary:     Effort: No respiratory distress.     Breath sounds: Normal breath sounds. No wheezing or rales.  Abdominal:     General: Bowel sounds are normal.     Palpations: Abdomen is soft. There is no mass.     Tenderness: There is no abdominal tenderness. There is no guarding or rebound.  Musculoskeletal:        General: No tenderness. Normal range of motion.     Cervical back: Normal range of motion and neck supple.  Lymphadenopathy:     Cervical: No cervical adenopathy.  Skin:    General: Skin is warm.  Findings: No rash.  Neurological:     Mental Status: He is alert and oriented to person, place, and time.     Cranial Nerves: No cranial nerve deficit.     Deep Tendon Reflexes: Reflexes are normal and symmetric.     Wt Readings from Last 3 Encounters:  07/25/20 279 lb (126.6 kg)  04/10/20 297 lb (134.7 kg)  02/09/20 295 lb (133.8 kg)    BP 120/80   Pulse 84   Ht 5\' 11"  (1.803 m)   Wt 279 lb (126.6 kg)   BMI 38.91 kg/m   Assessment and Plan:  1. Type 2 diabetes mellitus without complication, without long-term current use of insulin (HCC) Chronic.  Controlled.  Stable.  Last A1c was 7.2.  Patient is currently tolerating Metformin 750 extended release well.  Patient has been doing better with his diet and has had a steady decline in his weight.  Although his blood sugars have been in the 140 range and thinking that he is going to be under the the limit for A1c and we will recheck that today. - Hemoglobin A1c  2. Hyperlipidemia, unspecified hyperlipidemia type Chronic.  Controlled.  Stable.  Triglycerides were elevated may be due to his diabetes or baseline hyperthyroid glyceride anemia.  We will check a lipid panel patient is fasting today for current evaluation. - Lipid Panel With LDL/HDL Ratio  3.  Essential hypertension Chronic.  Controlled.  Stable.  Patient continues to have a cough on the 100-25 mg chlorothiazide losartan combination.  We will drop the dose to 5012.5.  Review of patient's blood pressure today was 120/80 but patient is continuing to lose weight by design.  We will recheck blood pressure when he returns for his physical in December. - losartan-hydrochlorothiazide (HYZAAR) 50-12.5 MG tablet; Take 1 tablet by mouth daily.  Dispense: 90 tablet; Refill: 3

## 2020-08-13 ENCOUNTER — Encounter: Payer: Self-pay | Admitting: Family Medicine

## 2020-08-13 ENCOUNTER — Other Ambulatory Visit: Payer: Self-pay

## 2020-08-13 ENCOUNTER — Ambulatory Visit: Payer: Commercial Managed Care - PPO | Admitting: Family Medicine

## 2020-08-13 VITALS — BP 138/78 | HR 72 | Temp 98.2°F | Ht 71.0 in | Wt 286.0 lb

## 2020-08-13 DIAGNOSIS — R1084 Generalized abdominal pain: Secondary | ICD-10-CM | POA: Diagnosis not present

## 2020-08-13 NOTE — Progress Notes (Signed)
Date:  08/13/2020   Name:  David Haley   DOB:  1956-08-11   MRN:  [id number]   Chief Complaint: Abdominal Pain (started with abdominal pain x 5 days ago- woke up with chills. chills have gone, but still having abd. soreness. DOES NOT have appendix)  Abdominal Pain This is a new problem. The current episode started in the past 7 days. The onset quality is sudden. The problem occurs constantly. The problem has been unchanged. The pain is located in the periumbilical region. The pain is at a severity of 1/10. The pain is mild. The quality of the pain is a sensation of fullness. The abdominal pain does not radiate. Pertinent negatives include no constipation, diarrhea, dysuria, fever, frequency, headaches, hematochezia, hematuria, myalgias or nausea. Associated symptoms comments: mesh.    Lab Results  Component Value Date   CREATININE 1.05 04/10/2020   BUN 15 04/10/2020   NA 139 04/10/2020   K 4.3 04/10/2020   CL 100 04/10/2020   CO2 21 04/10/2020   Lab Results  Component Value Date   CHOL 165 07/25/2020   HDL 41 07/25/2020   LDLCALC 89 07/25/2020   TRIG 173 (H) 07/25/2020   CHOLHDL 4.0 07/25/2020   Lab Results  Component Value Date   TSH 4.130 04/10/2020   Lab Results  Component Value Date   HGBA1C 6.4 (H) 07/25/2020   Lab Results  Component Value Date   WBC 9.5 12/22/2013   HGB 14.8 12/22/2013   HCT 44.4 12/22/2013   MCV 92 12/22/2013   PLT 244 12/22/2013   Lab Results  Component Value Date   ALT 38 10/12/2019   AST 25 10/12/2019   ALKPHOS 67 10/12/2019   BILITOT 0.4 10/12/2019     Review of Systems  Constitutional: Negative for chills and fever.  HENT: Negative for drooling, ear discharge, ear pain and sore throat.   Respiratory: Negative for cough, shortness of breath and wheezing.   Cardiovascular: Negative for chest pain, palpitations and leg swelling.  Gastrointestinal: Positive for abdominal pain. Negative for blood in stool, constipation,  diarrhea, hematochezia and nausea.  Endocrine: Negative for polydipsia.  Genitourinary: Negative for dysuria, frequency, hematuria and urgency.  Musculoskeletal: Negative for back pain, myalgias and neck pain.  Skin: Negative for rash.  Allergic/Immunologic: Negative for environmental allergies.  Neurological: Negative for dizziness and headaches.  Hematological: Does not bruise/bleed easily.  Psychiatric/Behavioral: Negative for suicidal ideas. The patient is not nervous/anxious.     Patient Active Problem List   Diagnosis Date Noted  . Diabetes mellitus, type 2 (HCC) 08/26/2018  . HTN (hypertension) 03/22/2014    No Known Allergies  Past Surgical History:  Procedure Laterality Date  . ANKLE SURGERY Right 2008   tendon repair  . APPENDECTOMY  1987  . HERNIA REPAIR  2004   umbilical  . HERNIA REPAIR  2014   mesh  . precancerous mole Left 01/2018   shoulder  . SHOULDER SURGERY Left 1992   arthroscopy  . SQUAMOUS CELL CARCINOMA EXCISION Right 2017   R) arm   . TONSILLECTOMY  1968    Social History   Tobacco Use  . Smoking status: Never Smoker  . Smokeless tobacco: Never Used  Substance Use Topics  . Alcohol use: Yes    Comment: 5-6 /week  . Drug use: Yes    Types: Marijuana     Medication list has been reviewed and updated.  Current Meds  Medication Sig  . Ascorbic Acid (VITAMIN  C) 1000 MG tablet Take 3,000 mg by mouth daily.  Marland Kitchen aspirin EC 81 MG tablet Take 1 tablet (81 mg total) by mouth daily.  Marland Kitchen losartan-hydrochlorothiazide (HYZAAR) 50-12.5 MG tablet Take 1 tablet by mouth daily.  . metFORMIN (GLUCOPHAGE-XR) 750 MG 24 hr tablet Take 1 tablet (750 mg total) by mouth daily with breakfast.  . Multiple Vitamins-Minerals (CENTRUM MEN) TABS Take 1 tablet by mouth daily.    PHQ 2/9 Scores 07/25/2020 04/10/2020 02/09/2020 10/12/2019  PHQ - 2 Score 0 0 0 0  PHQ- 9 Score 0 0 0 0    GAD 7 : Generalized Anxiety Score 07/25/2020 02/09/2020 10/12/2019  Nervous, Anxious,  on Edge 0 0 0  Control/stop worrying 0 0 0  Worry too much - different things 0 0 0  Trouble relaxing 0 0 0  Restless 0 0 0  Easily annoyed or irritable 0 0 0  Afraid - awful might happen 0 0 0  Total GAD 7 Score 0 0 0    BP Readings from Last 3 Encounters:  08/13/20 138/78  07/25/20 120/80  04/10/20 138/86    Physical Exam Vitals and nursing note reviewed.  HENT:     Head: Normocephalic.     Right Ear: External ear normal.     Left Ear: External ear normal.     Nose: Nose normal.     Mouth/Throat:     Mouth: Mucous membranes are moist.  Eyes:     General: No scleral icterus.       Right eye: No discharge.        Left eye: No discharge.     Conjunctiva/sclera: Conjunctivae normal.     Pupils: Pupils are equal, round, and reactive to light.  Neck:     Thyroid: No thyromegaly.     Vascular: No JVD.     Trachea: No tracheal deviation.  Cardiovascular:     Rate and Rhythm: Normal rate and regular rhythm.     Heart sounds: Normal heart sounds. No murmur heard.  No friction rub. No gallop.   Pulmonary:     Effort: No respiratory distress.     Breath sounds: Normal breath sounds. No wheezing or rales.  Abdominal:     General: Bowel sounds are normal.     Palpations: Abdomen is soft. There is no hepatomegaly, splenomegaly or mass.     Tenderness: There is no abdominal tenderness. There is no guarding or rebound.  Musculoskeletal:        General: No tenderness. Normal range of motion.     Cervical back: Normal range of motion and neck supple.  Lymphadenopathy:     Cervical: No cervical adenopathy.  Skin:    General: Skin is warm.     Findings: No rash.  Neurological:     Mental Status: He is alert and oriented to person, place, and time.     Cranial Nerves: No cranial nerve deficit.     Deep Tendon Reflexes: Reflexes are normal and symmetric.     Wt Readings from Last 3 Encounters:  08/13/20 286 lb (129.7 kg)  07/25/20 279 lb (126.6 kg)  04/10/20 297 lb (134.7  kg)    BP 138/78   Pulse 72   Temp 98.2 F (36.8 C) (Oral)   Ht 5\' 11"  (1.803 m)   Wt 286 lb (129.7 kg)   BMI 39.89 kg/m   Assessment and Plan: 1. Generalized abdominal pain New onset.  Persistent.  Stable.  This is more of  a fullness or sensation of an area that is periumbilical.  Patient had a repair of an umbilical hernia with mesh the second go around.  This is about the area that the patient has discomfort.  Patient has a negative Carnett's sign to suggest musculoskeletal there is no palpable concern but this will be a time that we can recheck his CBC as well as hepatic and lipase concerns. - CBC with Differential/Platelet - Hepatic Function Panel (6) - Lipase

## 2020-08-14 LAB — CBC WITH DIFFERENTIAL/PLATELET
Basophils Absolute: 0.1 10*3/uL (ref 0.0–0.2)
Basos: 1 %
EOS (ABSOLUTE): 0.2 10*3/uL (ref 0.0–0.4)
Eos: 2 %
Hematocrit: 43.3 % (ref 37.5–51.0)
Hemoglobin: 14.6 g/dL (ref 13.0–17.7)
Immature Grans (Abs): 0.1 10*3/uL (ref 0.0–0.1)
Immature Granulocytes: 1 %
Lymphocytes Absolute: 3 10*3/uL (ref 0.7–3.1)
Lymphs: 33 %
MCH: 30.2 pg (ref 26.6–33.0)
MCHC: 33.7 g/dL (ref 31.5–35.7)
MCV: 90 fL (ref 79–97)
Monocytes Absolute: 0.7 10*3/uL (ref 0.1–0.9)
Monocytes: 8 %
Neutrophils Absolute: 4.9 10*3/uL (ref 1.4–7.0)
Neutrophils: 55 %
Platelets: 263 10*3/uL (ref 150–450)
RBC: 4.84 x10E6/uL (ref 4.14–5.80)
RDW: 12.5 % (ref 11.6–15.4)
WBC: 8.9 10*3/uL (ref 3.4–10.8)

## 2020-08-14 LAB — HEPATIC FUNCTION PANEL (6)
ALT: 29 IU/L (ref 0–44)
AST: 24 IU/L (ref 0–40)
Albumin: 4.6 g/dL (ref 3.8–4.8)
Alkaline Phosphatase: 59 IU/L (ref 44–121)
Bilirubin Total: 0.3 mg/dL (ref 0.0–1.2)
Bilirubin, Direct: 0.11 mg/dL (ref 0.00–0.40)

## 2020-08-14 LAB — LIPASE: Lipase: 43 U/L (ref 13–78)

## 2020-08-29 ENCOUNTER — Encounter: Payer: Self-pay | Admitting: Family Medicine

## 2020-09-20 ENCOUNTER — Other Ambulatory Visit: Payer: Self-pay | Admitting: Family Medicine

## 2020-09-20 DIAGNOSIS — E119 Type 2 diabetes mellitus without complications: Secondary | ICD-10-CM

## 2020-10-15 ENCOUNTER — Ambulatory Visit (INDEPENDENT_AMBULATORY_CARE_PROVIDER_SITE_OTHER): Payer: Commercial Managed Care - PPO | Admitting: Family Medicine

## 2020-10-15 ENCOUNTER — Encounter: Payer: Self-pay | Admitting: Family Medicine

## 2020-10-15 ENCOUNTER — Other Ambulatory Visit: Payer: Self-pay

## 2020-10-15 VITALS — BP 130/70 | HR 80 | Ht 71.0 in | Wt 288.0 lb

## 2020-10-15 DIAGNOSIS — Z0000 Encounter for general adult medical examination without abnormal findings: Secondary | ICD-10-CM | POA: Diagnosis not present

## 2020-10-15 DIAGNOSIS — I10 Essential (primary) hypertension: Secondary | ICD-10-CM | POA: Diagnosis not present

## 2020-10-15 DIAGNOSIS — E119 Type 2 diabetes mellitus without complications: Secondary | ICD-10-CM

## 2020-10-15 MED ORDER — METFORMIN HCL ER 750 MG PO TB24: ORAL_TABLET | ORAL | 1 refills | Status: DC

## 2020-10-15 MED ORDER — LOSARTAN POTASSIUM-HCTZ 50-12.5 MG PO TABS: 1.0000 | ORAL_TABLET | Freq: Every day | ORAL | 1 refills | Status: DC

## 2020-10-15 NOTE — Progress Notes (Signed)
Date:  10/15/2020   Name:  David HillierKenneth E Burroughs   DOB:  02/07/1956   MRN:  [id number]   Chief Complaint: Annual Exam (needs diabetic foot exam)  Patient is a 64 year old male who presents for a comprehensive physical exam. The patient reports the following problems: hypertension/diabetes. Health maintenance has been reviewed up to date. Neurologic Problem The patient's pertinent negatives include no altered mental status, clumsiness, focal sensory loss, focal weakness, loss of balance, memory loss, near-syncope, slurred speech, syncope, visual change or weakness. Primary symptoms comment: abn smell. This is a new problem. The current episode started more than 1 month ago (6-8 weeks). The neurological problem developed insidiously. Progression since onset: intermitant. Pertinent negatives include no abdominal pain, auditory change, aura, back pain, chest pain, dizziness, fever, headaches, nausea, neck pain, palpitations or shortness of breath. Past treatments include nothing.    Lab Results  Component Value Date   CREATININE 1.05 04/10/2020   BUN 15 04/10/2020   NA 139 04/10/2020   K 4.3 04/10/2020   CL 100 04/10/2020   CO2 21 04/10/2020   Lab Results  Component Value Date   CHOL 165 07/25/2020   HDL 41 07/25/2020   LDLCALC 89 07/25/2020   TRIG 173 (H) 07/25/2020   CHOLHDL 4.0 07/25/2020   Lab Results  Component Value Date   TSH 4.130 04/10/2020   Lab Results  Component Value Date   HGBA1C 6.4 (H) 07/25/2020   Lab Results  Component Value Date   WBC 8.9 08/13/2020   HGB 14.6 08/13/2020   HCT 43.3 08/13/2020   MCV 90 08/13/2020   PLT 263 08/13/2020   Lab Results  Component Value Date   ALT 29 08/13/2020   AST 24 08/13/2020   ALKPHOS 59 08/13/2020   BILITOT 0.3 08/13/2020     Review of Systems  Constitutional: Negative for chills and fever.  HENT: Negative for drooling, ear discharge, ear pain and sore throat.   Respiratory: Negative for cough, shortness of  breath and wheezing.   Cardiovascular: Negative for chest pain, palpitations, leg swelling and near-syncope.  Gastrointestinal: Negative for abdominal pain, blood in stool, constipation, diarrhea and nausea.  Endocrine: Negative for polydipsia.  Genitourinary: Negative for dysuria, frequency, hematuria and urgency.  Musculoskeletal: Negative for back pain, myalgias and neck pain.  Skin: Negative for rash.  Allergic/Immunologic: Negative for environmental allergies.  Neurological: Negative for dizziness, focal weakness, syncope, weakness, headaches and loss of balance.  Hematological: Does not bruise/bleed easily.  Psychiatric/Behavioral: Negative for memory loss and suicidal ideas. The patient is not nervous/anxious.     Patient Active Problem List   Diagnosis Date Noted  . Diabetes mellitus, type 2 (HCC) 08/26/2018  . HTN (hypertension) 03/22/2014    No Known Allergies  Past Surgical History:  Procedure Laterality Date  . ANKLE SURGERY Right 2008   tendon repair  . APPENDECTOMY  1987  . HERNIA REPAIR  2004   umbilical  . HERNIA REPAIR  2014   mesh  . precancerous mole Left 01/2018   shoulder  . SHOULDER SURGERY Left 1992   arthroscopy  . SQUAMOUS CELL CARCINOMA EXCISION Right 2017   R) arm   . TONSILLECTOMY  1968    Social History   Tobacco Use  . Smoking status: Never Smoker  . Smokeless tobacco: Never Used  Substance Use Topics  . Alcohol use: Yes    Comment: 5-6 /week  . Drug use: Yes    Types: Marijuana  Medication list has been reviewed and updated.  Current Meds  Medication Sig  . Ascorbic Acid (VITAMIN C) 1000 MG tablet Take 3,000 mg by mouth daily.  Marland Kitchen aspirin EC 81 MG tablet Take 1 tablet (81 mg total) by mouth daily.  Marland Kitchen losartan-hydrochlorothiazide (HYZAAR) 50-12.5 MG tablet Take 1 tablet by mouth daily.  . metFORMIN (GLUCOPHAGE-XR) 750 MG 24 hr tablet TAKE 1 TABLET(750 MG) BY MOUTH DAILY WITH BREAKFAST  . Multiple Vitamins-Minerals (CENTRUM  MEN) TABS Take 1 tablet by mouth daily.    PHQ 2/9 Scores 10/15/2020 07/25/2020 04/10/2020 02/09/2020  PHQ - 2 Score 0 0 0 0  PHQ- 9 Score 0 0 0 0    GAD 7 : Generalized Anxiety Score 10/15/2020 07/25/2020 02/09/2020 10/12/2019  Nervous, Anxious, on Edge 0 0 0 0  Control/stop worrying 0 0 0 0  Worry too much - different things 0 0 0 0  Trouble relaxing 0 0 0 0  Restless 0 0 0 0  Easily annoyed or irritable 0 0 0 0  Afraid - awful might happen 0 0 0 0  Total GAD 7 Score 0 0 0 0    BP Readings from Last 3 Encounters:  10/15/20 130/70  08/13/20 138/78  07/25/20 120/80    Physical Exam Vitals and nursing note reviewed.  Constitutional:      Appearance: Normal appearance. He is well-groomed. He is obese.  HENT:     Head: Normocephalic.     Jaw: There is normal jaw occlusion.     Salivary Glands: Right salivary gland is not diffusely enlarged or tender. Left salivary gland is not diffusely enlarged or tender.     Right Ear: Hearing, tympanic membrane, ear canal and external ear normal.     Left Ear: Hearing, tympanic membrane, ear canal and external ear normal.     Nose: Nose normal. No nasal deformity, septal deviation, signs of injury, laceration, nasal tenderness, mucosal edema, congestion or rhinorrhea.     Right Nostril: No foreign body.     Left Nostril: No foreign body.     Right Turbinates: Not enlarged.     Left Turbinates: Not enlarged.     Mouth/Throat:     Lips: Pink.     Mouth: Mucous membranes are moist.     Dentition: Normal dentition.     Tongue: No lesions.     Palate: No mass.     Pharynx: Oropharynx is clear. Uvula midline. No pharyngeal swelling, oropharyngeal exudate, posterior oropharyngeal erythema or uvula swelling.  Eyes:     General: Lids are normal. Vision grossly intact. Gaze aligned appropriately. No scleral icterus.       Right eye: No discharge.        Left eye: No discharge.     Extraocular Movements: Extraocular movements intact.      Conjunctiva/sclera: Conjunctivae normal.     Pupils: Pupils are equal, round, and reactive to light. Pupils are equal.     Funduscopic exam:    Right eye: Red reflex present.        Left eye: Red reflex present. Neck:     Thyroid: No thyroid mass, thyromegaly or thyroid tenderness.     Vascular: Normal carotid pulses. No carotid bruit, hepatojugular reflux or JVD.     Trachea: Trachea and phonation normal. No tracheal deviation.  Cardiovascular:     Rate and Rhythm: Normal rate and regular rhythm.     Chest Wall: PMI is not displaced. No thrill.  Pulses: Normal pulses. No decreased pulses.          Carotid pulses are 2+ on the right side and 2+ on the left side.      Radial pulses are 2+ on the right side and 2+ on the left side.       Femoral pulses are 2+ on the right side and 2+ on the left side.      Popliteal pulses are 2+ on the right side and 2+ on the left side.       Dorsalis pedis pulses are 2+ on the right side and 2+ on the left side.       Posterior tibial pulses are 2+ on the right side and 2+ on the left side.     Heart sounds: Normal heart sounds, S1 normal and S2 normal. Heart sounds not distant. No murmur heard.  No systolic murmur is present.  No diastolic murmur is present.  No friction rub. No gallop. No S3 or S4 sounds.   Pulmonary:     Effort: Pulmonary effort is normal. No respiratory distress.     Breath sounds: Normal breath sounds and air entry. No decreased breath sounds, wheezing, rhonchi or rales.  Chest:     Breasts: Breasts are symmetrical.        Right: Normal. No swelling, bleeding, inverted nipple, mass, nipple discharge, skin change or tenderness.        Left: Normal. No swelling, bleeding, inverted nipple, mass, nipple discharge, skin change or tenderness.  Abdominal:     General: Bowel sounds are normal.     Palpations: Abdomen is soft. There is no hepatomegaly, splenomegaly or mass.     Tenderness: There is no abdominal tenderness. There is  no right CVA tenderness, left CVA tenderness, guarding or rebound.  Genitourinary:    Penis: Normal.      Testes: Normal.        Right: Mass not present.        Left: Mass not present.     Epididymis:     Right: Normal.     Left: Normal.  Musculoskeletal:        General: No tenderness. Normal range of motion.     Cervical back: Full passive range of motion without pain, normal range of motion and neck supple. No rigidity or tenderness.     Right lower leg: No edema.     Left lower leg: No edema.  Feet:     Right foot:     Protective Sensation: 10 sites tested. 10 sites sensed.     Skin integrity: Skin integrity normal. No ulcer, blister, skin breakdown, erythema, warmth, callus, dry skin or fissure.     Toenail Condition: Right toenails are normal.     Left foot:     Protective Sensation: 10 sites tested. 10 sites sensed.     Skin integrity: Skin integrity normal. No ulcer, blister, skin breakdown, erythema, warmth, callus, dry skin or fissure.     Toenail Condition: Left toenails are normal.  Lymphadenopathy:     Head:     Right side of head: No submental, submandibular or tonsillar adenopathy.     Left side of head: No submental, submandibular or tonsillar adenopathy.     Cervical: No cervical adenopathy.     Right cervical: No superficial, deep or posterior cervical adenopathy.    Left cervical: No superficial, deep or posterior cervical adenopathy.     Upper Body:     Right upper  body: No supraclavicular, axillary or pectoral adenopathy.     Left upper body: No supraclavicular, axillary or pectoral adenopathy.  Skin:    General: Skin is warm.     Capillary Refill: Capillary refill takes less than 2 seconds.     Findings: No rash.  Neurological:     Mental Status: He is alert and oriented to person, place, and time.     Cranial Nerves: Cranial nerves are intact. No cranial nerve deficit.     Sensory: Sensation is intact.     Motor: Motor function is intact.      Coordination: Coordination is intact.     Gait: Gait is intact.     Deep Tendon Reflexes: Reflexes are normal and symmetric.  Psychiatric:        Attention and Perception: Attention and perception normal.        Mood and Affect: Mood and affect normal.        Speech: Speech normal.        Behavior: Behavior normal. Behavior is cooperative.     Wt Readings from Last 3 Encounters:  10/15/20 288 lb (130.6 kg)  08/13/20 286 lb (129.7 kg)  07/25/20 279 lb (126.6 kg)    BP 130/70   Pulse 80   Ht 5\' 11"  (1.803 m)   Wt 288 lb (130.6 kg)   BMI 40.17 kg/m   Assessment and Plan:  1. Annual physical exam No subjective/objective concerns noted during performance of a medically appropriate examination and history.Laray Corbit Lucus is a 64 y.o. male who presents today for his Complete Annual Exam. He feels well. He reports exercising . He reports he is sleeping well.Immunizations are reviewed and recommendations provided.   Age appropriate screening tests are discussed. Counseling given for risk factor reduction interventions.  Patient's most recent labs were reviewed and thought to be sufficient at this time.  2. Essential hypertension Chronic.  Controlled.  Stable.  Blood pressure noted today to be 130/70.  Will continue losartan 50-12.5 mg daily. - losartan-hydrochlorothiazide (HYZAAR) 50-12.5 MG tablet; Take 1 tablet by mouth daily.  Dispense: 90 tablet; Refill: 1  3. Type 2 diabetes mellitus without complication, without long-term current use of insulin (HCC) Chronic.  Controlled.  Stable.  Continue Metformin XR 750 mg daily.  Will check A1c for current status of diabetes. - metFORMIN (GLUCOPHAGE-XR) 750 MG 24 hr tablet; TAKE 1 TABLET(750 MG) BY MOUTH DAILY WITH BREAKFAST  Dispense: 90 tablet; Refill: 1 - Hemoglobin A1c

## 2020-10-16 LAB — RENAL FUNCTION PANEL
Albumin: 4.4 g/dL (ref 3.8–4.8)
BUN/Creatinine Ratio: 17 (ref 10–24)
BUN: 16 mg/dL (ref 8–27)
CO2: 23 mmol/L (ref 20–29)
Calcium: 9.5 mg/dL (ref 8.6–10.2)
Chloride: 98 mmol/L (ref 96–106)
Creatinine, Ser: 0.96 mg/dL (ref 0.76–1.27)
GFR calc Af Amer: 98 mL/min/{1.73_m2} (ref >59)
GFR calc non Af Amer: 84 mL/min/{1.73_m2} (ref >59)
Glucose: 147 mg/dL — ABNORMAL HIGH (ref 65–99)
Phosphorus: 2.5 mg/dL — ABNORMAL LOW (ref 2.8–4.1)
Potassium: 4.3 mmol/L (ref 3.5–5.2)
Sodium: 137 mmol/L (ref 134–144)

## 2020-10-16 LAB — HEMOGLOBIN A1C
Est. average glucose Bld gHb Est-mCnc: 146 mg/dL
Hgb A1c MFr Bld: 6.7 % — ABNORMAL HIGH (ref 4.8–5.6)

## 2020-12-18 LAB — HM DIABETES EYE EXAM

## 2020-12-21 ENCOUNTER — Encounter: Payer: Self-pay | Admitting: Family Medicine

## 2020-12-21 ENCOUNTER — Ambulatory Visit: Payer: Commercial Managed Care - PPO | Admitting: Family Medicine

## 2020-12-21 ENCOUNTER — Other Ambulatory Visit: Payer: Self-pay

## 2020-12-21 VITALS — BP 140/78 | HR 72 | Ht 71.0 in | Wt 295.0 lb

## 2020-12-21 DIAGNOSIS — S61209A Unspecified open wound of unspecified finger without damage to nail, initial encounter: Secondary | ICD-10-CM

## 2020-12-21 MED ORDER — MELOXICAM 15 MG PO TABS: 15.0000 mg | ORAL_TABLET | Freq: Every day | ORAL | 0 refills | Status: DC

## 2020-12-21 NOTE — Progress Notes (Signed)
Date:  12/21/2020   Name:  David Haley   DOB:  1955/12/10   MRN:  [id number]   Chief Complaint: Hand Pain (Middle finger on L) hand hurting. The pointer and ring finger on same hand are bothering him as well. Hurting worse at the base of the finger)  Hand Pain  The incident occurred more than 1 week ago (2 week). The incident occurred at home. Injury mechanism: snagging injury. The pain is present in the left fingers (middle). The quality of the pain is described as aching. The pain radiates to the left hand. The pain is at a severity of 8/10. The pain is moderate. The pain has been fluctuating since the incident. Pertinent negatives include no chest pain, numbness or tingling.    Lab Results  Component Value Date   CREATININE 0.96 10/15/2020   BUN 16 10/15/2020   NA 137 10/15/2020   K 4.3 10/15/2020   CL 98 10/15/2020   CO2 23 10/15/2020   Lab Results  Component Value Date   CHOL 165 07/25/2020   HDL 41 07/25/2020   LDLCALC 89 07/25/2020   TRIG 173 (H) 07/25/2020   CHOLHDL 4.0 07/25/2020   Lab Results  Component Value Date   TSH 4.130 04/10/2020   Lab Results  Component Value Date   HGBA1C 6.7 (H) 10/15/2020   Lab Results  Component Value Date   WBC 8.9 08/13/2020   HGB 14.6 08/13/2020   HCT 43.3 08/13/2020   MCV 90 08/13/2020   PLT 263 08/13/2020   Lab Results  Component Value Date   ALT 29 08/13/2020   AST 24 08/13/2020   ALKPHOS 59 08/13/2020   BILITOT 0.3 08/13/2020     Review of Systems  Constitutional: Negative for chills and fever.  HENT: Negative for drooling, ear discharge, ear pain and sore throat.   Respiratory: Negative for cough, shortness of breath and wheezing.   Cardiovascular: Negative for chest pain, palpitations and leg swelling.  Gastrointestinal: Negative for abdominal pain, blood in stool, constipation, diarrhea and nausea.  Endocrine: Negative for polydipsia.  Genitourinary: Negative for dysuria, frequency, hematuria and  urgency.  Musculoskeletal: Negative for back pain, myalgias and neck pain.  Skin: Negative for rash.  Allergic/Immunologic: Negative for environmental allergies.  Neurological: Negative for dizziness, tingling, numbness and headaches.  Hematological: Does not bruise/bleed easily.  Psychiatric/Behavioral: Negative for suicidal ideas. The patient is not nervous/anxious.     Patient Active Problem List   Diagnosis Date Noted  . Diabetes mellitus, type 2 (HCC) 08/26/2018  . HTN (hypertension) 03/22/2014    No Known Allergies  Past Surgical History:  Procedure Laterality Date  . ANKLE SURGERY Right 2008   tendon repair  . APPENDECTOMY  1987  . HERNIA REPAIR  2004   umbilical  . HERNIA REPAIR  2014   mesh  . precancerous mole Left 01/2018   shoulder  . SHOULDER SURGERY Left 1992   arthroscopy  . SQUAMOUS CELL CARCINOMA EXCISION Right 2017   R) arm   . TONSILLECTOMY  1968    Social History   Tobacco Use  . Smoking status: Never Smoker  . Smokeless tobacco: Never Used  Substance Use Topics  . Alcohol use: Yes    Comment: 5-6 /week  . Drug use: Yes    Types: Marijuana     Medication list has been reviewed and updated.  Current Meds  Medication Sig  . Ascorbic Acid (VITAMIN C) 1000 MG tablet Take 3,000 mg  by mouth daily.  Marland Kitchen aspirin EC 81 MG tablet Take 1 tablet (81 mg total) by mouth daily.  Marland Kitchen losartan-hydrochlorothiazide (HYZAAR) 50-12.5 MG tablet Take 1 tablet by mouth daily.  . metFORMIN (GLUCOPHAGE-XR) 750 MG 24 hr tablet TAKE 1 TABLET(750 MG) BY MOUTH DAILY WITH BREAKFAST  . Multiple Vitamins-Minerals (CENTRUM MEN) TABS Take 1 tablet by mouth daily.    PHQ 2/9 Scores 10/15/2020 07/25/2020 04/10/2020 02/09/2020  PHQ - 2 Score 0 0 0 0  PHQ- 9 Score 0 0 0 0    GAD 7 : Generalized Anxiety Score 10/15/2020 07/25/2020 02/09/2020 10/12/2019  Nervous, Anxious, on Edge 0 0 0 0  Control/stop worrying 0 0 0 0  Worry too much - different things 0 0 0 0  Trouble relaxing 0  0 0 0  Restless 0 0 0 0  Easily annoyed or irritable 0 0 0 0  Afraid - awful might happen 0 0 0 0  Total GAD 7 Score 0 0 0 0    BP Readings from Last 3 Encounters:  12/21/20 140/78  10/15/20 130/70  08/13/20 138/78    Physical Exam Vitals and nursing note reviewed.  Cardiovascular:     Heart sounds: S1 normal and S2 normal.   No systolic murmur is present.  No diastolic murmur is present. No gallop. No S3 or S4 sounds.   Pulmonary:     Breath sounds: Normal breath sounds. No decreased breath sounds, wheezing, rhonchi or rales.  Musculoskeletal:     Left hand: Tenderness present. No deformity. Decreased range of motion. Decreased strength.     Comments: Tender flexor aspect MP left finger     Wt Readings from Last 3 Encounters:  12/21/20 295 lb (133.8 kg)  10/15/20 288 lb (130.6 kg)  08/13/20 286 lb (129.7 kg)    BP 140/78   Pulse 72   Ht 5\' 11"  (1.803 m)   Wt 295 lb (133.8 kg)   BMI 41.14 kg/m   Assessment and Plan:  1. Ring avulsion injury of finger New onset.  Persistent.  Patient sustained a ring injury when a ring that he was accustomed to wearing on the opposite hand got caught in a door and forcefully extended the left middle finger.  Patient is tender in the flexor aspect of the MP joint and has weakness with flexion of the MP joint.  I suspect there is an avulsion injury either partial or total and would refer to orthopedic for evaluation in the meantime we will refill the meloxicam 15 mg once a day. - meloxicam (MOBIC) 15 MG tablet; Take 1 tablet (15 mg total) by mouth daily.  Dispense: 30 tablet; Refill: 0 - Ambulatory referral to Orthopedic Surgery

## 2021-01-15 ENCOUNTER — Other Ambulatory Visit: Payer: Self-pay | Admitting: Family Medicine

## 2021-01-15 DIAGNOSIS — S61209A Unspecified open wound of unspecified finger without damage to nail, initial encounter: Secondary | ICD-10-CM

## 2021-02-12 ENCOUNTER — Ambulatory Visit: Payer: Commercial Managed Care - PPO | Admitting: Family Medicine

## 2021-02-18 ENCOUNTER — Ambulatory Visit: Payer: Commercial Managed Care - PPO | Admitting: Family Medicine

## 2021-02-18 ENCOUNTER — Other Ambulatory Visit: Payer: Self-pay

## 2021-02-18 ENCOUNTER — Encounter: Payer: Self-pay | Admitting: Family Medicine

## 2021-02-18 VITALS — BP 148/80 | HR 76 | Ht 71.0 in | Wt 301.0 lb

## 2021-02-18 DIAGNOSIS — E785 Hyperlipidemia, unspecified: Secondary | ICD-10-CM | POA: Diagnosis not present

## 2021-02-18 DIAGNOSIS — E119 Type 2 diabetes mellitus without complications: Secondary | ICD-10-CM | POA: Diagnosis not present

## 2021-02-18 DIAGNOSIS — I10 Essential (primary) hypertension: Secondary | ICD-10-CM

## 2021-02-18 MED ORDER — METFORMIN HCL ER 750 MG PO TB24
ORAL_TABLET | ORAL | 1 refills | Status: DC
Start: 2021-02-18 — End: 2021-10-15

## 2021-02-18 MED ORDER — LOSARTAN POTASSIUM-HCTZ 50-12.5 MG PO TABS: 1.0000 | ORAL_TABLET | Freq: Every day | ORAL | 1 refills | Status: DC

## 2021-02-18 NOTE — Progress Notes (Signed)
Date:  02/18/2021   Name:  David Haley   DOB:  10-Aug-1956   MRN:  [id number]   Chief Complaint: Diabetes and Hypertension  Diabetes He presents for his follow-up diabetic visit. He has type 2 diabetes mellitus. His disease course has been stable. There are no hypoglycemic associated symptoms. Pertinent negatives for diabetes include no blurred vision, no chest pain, no fatigue, no foot paresthesias, no foot ulcerations, no polydipsia, no polyphagia, no polyuria, no visual change, no weakness and no weight loss. There are no hypoglycemic complications. Symptoms are stable. There are no diabetic complications. Risk factors for coronary artery disease include hypertension and diabetes mellitus.  Hypertension Pertinent negatives include no blurred vision or chest pain.    Lab Results  Component Value Date   CREATININE 0.96 10/15/2020   BUN 16 10/15/2020   NA 137 10/15/2020   K 4.3 10/15/2020   CL 98 10/15/2020   CO2 23 10/15/2020   Lab Results  Component Value Date   CHOL 165 07/25/2020   HDL 41 07/25/2020   LDLCALC 89 07/25/2020   TRIG 173 (H) 07/25/2020   CHOLHDL 4.0 07/25/2020   Lab Results  Component Value Date   TSH 4.130 04/10/2020   Lab Results  Component Value Date   HGBA1C 6.7 (H) 10/15/2020   Lab Results  Component Value Date   WBC 8.9 08/13/2020   HGB 14.6 08/13/2020   HCT 43.3 08/13/2020   MCV 90 08/13/2020   PLT 263 08/13/2020   Lab Results  Component Value Date   ALT 29 08/13/2020   AST 24 08/13/2020   ALKPHOS 59 08/13/2020   BILITOT 0.3 08/13/2020     Review of Systems  Constitutional: Negative for fatigue and weight loss.  Eyes: Negative for blurred vision.  Cardiovascular: Negative for chest pain.  Endocrine: Negative for polydipsia, polyphagia and polyuria.  Neurological: Negative for weakness.    Patient Active Problem List   Diagnosis Date Noted  . Diabetes mellitus, type 2 (HCC) 08/26/2018  . HTN (hypertension) 03/22/2014     No Known Allergies  Past Surgical History:  Procedure Laterality Date  . ANKLE SURGERY Right 2008   tendon repair  . APPENDECTOMY  1987  . HERNIA REPAIR  2004   umbilical  . HERNIA REPAIR  2014   mesh  . precancerous mole Left 01/2018   shoulder  . SHOULDER SURGERY Left 1992   arthroscopy  . SQUAMOUS CELL CARCINOMA EXCISION Right 2017   R) arm   . TONSILLECTOMY  1968    Social History   Tobacco Use  . Smoking status: Never Smoker  . Smokeless tobacco: Never Used  Substance Use Topics  . Alcohol use: Yes    Comment: 5-6 /week  . Drug use: Yes    Types: Marijuana     Medication list has been reviewed and updated.  Current Meds  Medication Sig  . Ascorbic Acid (VITAMIN C) 1000 MG tablet Take 3,000 mg by mouth daily.  Marland Kitchen aspirin EC 81 MG tablet Take 1 tablet (81 mg total) by mouth daily.  Marland Kitchen losartan-hydrochlorothiazide (HYZAAR) 50-12.5 MG tablet Take 1 tablet by mouth daily.  . metFORMIN (GLUCOPHAGE-XR) 750 MG 24 hr tablet TAKE 1 TABLET(750 MG) BY MOUTH DAILY WITH BREAKFAST  . Multiple Vitamins-Minerals (CENTRUM MEN) TABS Take 1 tablet by mouth daily.  . Omega-3 1000 MG CAPS Take 1 capsule by mouth daily.    PHQ 2/9 Scores 02/18/2021 10/15/2020 07/25/2020 04/10/2020  PHQ - 2 Score  0 0 0 0  PHQ- 9 Score 0 0 0 0    GAD 7 : Generalized Anxiety Score 02/18/2021 10/15/2020 07/25/2020 02/09/2020  Nervous, Anxious, on Edge 0 0 0 0  Control/stop worrying 0 0 0 0  Worry too much - different things 0 0 0 0  Trouble relaxing 0 0 0 0  Restless 0 0 0 0  Easily annoyed or irritable 0 0 0 0  Afraid - awful might happen 0 0 0 0  Total GAD 7 Score 0 0 0 0    BP Readings from Last 3 Encounters:  02/18/21 (!) 150/80  12/21/20 140/78  10/15/20 130/70    Physical Exam  Wt Readings from Last 3 Encounters:  02/18/21 (!) 301 lb (136.5 kg)  12/21/20 295 lb (133.8 kg)  10/15/20 288 lb (130.6 kg)    BP (!) 150/80   Pulse 76   Ht 5\' 11"  (1.803 m)   Wt (!) 301 lb (136.5 kg)    BMI 41.98 kg/m   Assessment and Plan:  1. Essential hypertension Chronic.  Controlled.  Stable.  Blood pressure today is 148/80.  We will continue losartan hydrochlorothiazide 50-12.5 mg once a day.  We will recheck this in 6 months.  We will check a renal function panel for electrolytes and GFR. - Renal Function Panel - losartan-hydrochlorothiazide (HYZAAR) 50-12.5 MG tablet; Take 1 tablet by mouth daily.  Dispense: 90 tablet; Refill: 1  2. Hyperlipidemia, unspecified hyperlipidemia type Chronic we will continue Metformin XR 750 mg once a day.  Will check lipid panel.  We will continue to encourage diet. - Lipid Panel With LDL/HDL Ratio  3. Type 2 diabetes mellitus without complication, without long-term current use of insulin (HCC) Chronic.  Controlled.  Stable.  Continue Metformin XR 750 mg once a day.  Will check renal function panel microalbuminuria and A1c. - HgB A1c - Microalbumin, urine - Renal Function Panel - metFORMIN (GLUCOPHAGE-XR) 750 MG 24 hr tablet; TAKE 1 TABLET(750 MG) BY MOUTH DAILY WITH BREAKFAST  Dispense: 90 tablet; Refill: 1

## 2021-02-19 LAB — RENAL FUNCTION PANEL
Albumin: 4.8 g/dL (ref 3.8–4.8)
BUN/Creatinine Ratio: 18 (ref 10–24)
BUN: 18 mg/dL (ref 8–27)
CO2: 23 mmol/L (ref 20–29)
Calcium: 9.8 mg/dL (ref 8.6–10.2)
Chloride: 99 mmol/L (ref 96–106)
Creatinine, Ser: 0.98 mg/dL (ref 0.76–1.27)
Glucose: 143 mg/dL — ABNORMAL HIGH (ref 65–99)
Phosphorus: 2.8 mg/dL (ref 2.8–4.1)
Potassium: 4.8 mmol/L (ref 3.5–5.2)
Sodium: 140 mmol/L (ref 134–144)
eGFR: 87 mL/min/{1.73_m2} (ref >59)

## 2021-02-19 LAB — HEMOGLOBIN A1C
Est. average glucose Bld gHb Est-mCnc: 148 mg/dL
Hgb A1c MFr Bld: 6.8 % — ABNORMAL HIGH (ref 4.8–5.6)

## 2021-02-19 LAB — LIPID PANEL WITH LDL/HDL RATIO
Cholesterol, Total: 166 mg/dL (ref 100–199)
HDL: 46 mg/dL (ref >39)
LDL Chol Calc (NIH): 79 mg/dL (ref 0–99)
LDL/HDL Ratio: 1.7 ratio (ref 0.0–3.6)
Triglycerides: 248 mg/dL — ABNORMAL HIGH (ref 0–149)
VLDL Cholesterol Cal: 41 mg/dL — ABNORMAL HIGH (ref 5–40)

## 2021-02-19 LAB — MICROALBUMIN, URINE

## 2021-06-04 ENCOUNTER — Encounter: Payer: Self-pay | Admitting: Family Medicine

## 2021-06-05 ENCOUNTER — Other Ambulatory Visit: Payer: Self-pay

## 2021-06-05 ENCOUNTER — Ambulatory Visit: Payer: Commercial Managed Care - PPO | Admitting: Family Medicine

## 2021-06-05 ENCOUNTER — Encounter: Payer: Self-pay | Admitting: Family Medicine

## 2021-06-05 VITALS — BP 124/70 | HR 60 | Ht 71.0 in | Wt 300.0 lb

## 2021-06-05 DIAGNOSIS — E785 Hyperlipidemia, unspecified: Secondary | ICD-10-CM | POA: Diagnosis not present

## 2021-06-05 DIAGNOSIS — E119 Type 2 diabetes mellitus without complications: Secondary | ICD-10-CM

## 2021-06-05 MED ORDER — ASPIRIN EC 81 MG PO TBEC: 81.0000 mg | DELAYED_RELEASE_TABLET | Freq: Every day | ORAL | 3 refills | Status: DC

## 2021-06-05 NOTE — Patient Instructions (Signed)

## 2021-06-05 NOTE — Progress Notes (Signed)
Date:  06/05/2021   Name:  David Haley   DOB:  March 16, 1956   MRN:  [id number]   Chief Complaint: Diabetes  Diabetes He presents for his follow-up diabetic visit. He has type 2 diabetes mellitus. His disease course has been stable. There are no hypoglycemic associated symptoms. Pertinent negatives for hypoglycemia include no dizziness, headaches or nervousness/anxiousness. There are no diabetic associated symptoms. Pertinent negatives for diabetes include no chest pain, no fatigue, no polydipsia, no polyphagia, no polyuria and no weight loss. There are no hypoglycemic complications. Symptoms are stable. There are no diabetic complications. Current diabetic treatment includes oral agent (monotherapy). He is compliant with treatment most of the time. His weight is stable. He is following a generally healthy diet. Meal planning includes avoidance of concentrated sweets and carbohydrate counting. He participates in exercise intermittently. His breakfast blood glucose is taken between 8-9 am. His breakfast blood glucose range is generally 130-140 mg/dl. An ACE inhibitor/angiotensin II receptor blocker is being taken.   Lab Results  Component Value Date   CREATININE 0.98 02/18/2021   BUN 18 02/18/2021   NA 140 02/18/2021   K 4.8 02/18/2021   CL 99 02/18/2021   CO2 23 02/18/2021   Lab Results  Component Value Date   CHOL 166 02/18/2021   HDL 46 02/18/2021   LDLCALC 79 02/18/2021   TRIG 248 (H) 02/18/2021   CHOLHDL 4.0 07/25/2020   Lab Results  Component Value Date   TSH 4.130 04/10/2020   Lab Results  Component Value Date   HGBA1C 6.8 (H) 02/18/2021   Lab Results  Component Value Date   WBC 8.9 08/13/2020   HGB 14.6 08/13/2020   HCT 43.3 08/13/2020   MCV 90 08/13/2020   PLT 263 08/13/2020   Lab Results  Component Value Date   ALT 29 08/13/2020   AST 24 08/13/2020   ALKPHOS 59 08/13/2020   BILITOT 0.3 08/13/2020     Review of Systems  Constitutional:  Negative for  chills, fatigue, fever and weight loss.  HENT:  Negative for drooling, ear discharge, ear pain and sore throat.   Respiratory:  Negative for cough, shortness of breath and wheezing.   Cardiovascular:  Negative for chest pain, palpitations and leg swelling.  Gastrointestinal:  Negative for abdominal pain, blood in stool, constipation, diarrhea and nausea.  Endocrine: Negative for polydipsia, polyphagia and polyuria.  Genitourinary:  Negative for dysuria, frequency, hematuria and urgency.  Musculoskeletal:  Negative for back pain, myalgias and neck pain.  Skin:  Negative for rash.  Allergic/Immunologic: Negative for environmental allergies.  Neurological:  Negative for dizziness and headaches.  Hematological:  Does not bruise/bleed easily.  Psychiatric/Behavioral:  Negative for suicidal ideas. The patient is not nervous/anxious.    Patient Active Problem List   Diagnosis Date Noted   Diabetes mellitus, type 2 (HCC) 08/26/2018   HTN (hypertension) 03/22/2014    No Known Allergies  Past Surgical History:  Procedure Laterality Date   ANKLE SURGERY Right 2008   tendon repair   APPENDECTOMY  1987   HERNIA REPAIR  2004   umbilical   HERNIA REPAIR  2014   mesh   precancerous mole Left 01/2018   shoulder   SHOULDER SURGERY Left 1992   arthroscopy   SQUAMOUS CELL CARCINOMA EXCISION Right 2017   R) arm    TONSILLECTOMY  1968    Social History   Tobacco Use   Smoking status: Never   Smokeless tobacco: Never  Substance Use  Topics   Alcohol use: Yes    Comment: 5-6 /week   Drug use: Yes    Types: Marijuana     Medication list has been reviewed and updated.  No outpatient medications have been marked as taking for the 06/05/21 encounter (Office Visit) with Duanne Limerick, MD.    [facility] 2/9 Scores 06/05/2021 02/18/2021 10/15/2020 07/25/2020  PHQ - 2 Score 0 0 0 0  PHQ- 9 Score 0 0 0 0    GAD 7 : Generalized Anxiety Score 06/05/2021 02/18/2021 10/15/2020 07/25/2020  Nervous,  Anxious, on Edge 0 0 0 0  Control/stop worrying 0 0 0 0  Worry too much - different things 0 0 0 0  Trouble relaxing 0 0 0 0  Restless 0 0 0 0  Easily annoyed or irritable 0 0 0 0  Afraid - awful might happen 0 0 0 0  Total GAD 7 Score 0 0 0 0    BP Readings from Last 3 Encounters:  06/05/21 124/70  02/18/21 (!) 148/80  12/21/20 140/78    Physical Exam Vitals and nursing note reviewed.  HENT:     Head: Normocephalic.     Right Ear: Tympanic membrane, ear canal and external ear normal.     Left Ear: Tympanic membrane, ear canal and external ear normal.     Nose: Nose normal.  Eyes:     General: No scleral icterus.       Right eye: No discharge.        Left eye: No discharge.     Conjunctiva/sclera: Conjunctivae normal.     Pupils: Pupils are equal, round, and reactive to light.  Neck:     Thyroid: No thyromegaly.     Vascular: No JVD.     Trachea: No tracheal deviation.  Cardiovascular:     Rate and Rhythm: Normal rate and regular rhythm.     Heart sounds: Normal heart sounds. No murmur heard.   No friction rub. No gallop.  Pulmonary:     Effort: No respiratory distress.     Breath sounds: Normal breath sounds. No wheezing or rales.  Abdominal:     General: Bowel sounds are normal.     Palpations: Abdomen is soft. There is no mass.     Tenderness: There is no abdominal tenderness. There is no guarding or rebound.  Musculoskeletal:        General: No tenderness. Normal range of motion.     Cervical back: Normal range of motion and neck supple.  Lymphadenopathy:     Cervical: No cervical adenopathy.  Skin:    General: Skin is warm.     Findings: No rash.  Neurological:     Mental Status: He is alert and oriented to person, place, and time.     Cranial Nerves: No cranial nerve deficit.     Deep Tendon Reflexes: Reflexes are normal and symmetric.    Wt Readings from Last 3 Encounters:  06/05/21 300 lb (136.1 kg)  02/18/21 (!) 301 lb (136.5 kg)  12/21/20 295 lb  (133.8 kg)    BP 124/70   Pulse 60   Ht 5\' 11"  (1.803 m)   Wt 300 lb (136.1 kg)   BMI 41.84 kg/m   Assessment and Plan:  1. Type 2 diabetes mellitus without complication, without long-term current use of insulin (HCC) Chronic.  Controlled.  Stable.  We will obtain a microalbuminuria and hemoglobin A1c and we will continue current treatment with metformin XR 750  once a day. - Microalbumin, urine - HgB A1c - aspirin EC 81 MG tablet; Take 1 tablet (81 mg total) by mouth daily.  Dispense: 90 tablet; Refill: 3  2. Hyperlipidemia, unspecified hyperlipidemia type Chronic.  Controlled.  Stable.  Patient has some increase in his triglycerides and is currently treating with diet and we will recheck lipids as needed. - Triglycerides

## 2021-06-06 LAB — HEMOGLOBIN A1C
Est. average glucose Bld gHb Est-mCnc: 160 mg/dL
Hgb A1c MFr Bld: 7.2 % — ABNORMAL HIGH (ref 4.8–5.6)

## 2021-06-06 LAB — MICROALBUMIN, URINE: Microalbumin, Urine: 18.2 ug/mL

## 2021-06-06 LAB — TRIGLYCERIDES: Triglycerides: 202 mg/dL — ABNORMAL HIGH (ref 0–149)

## 2021-10-01 ENCOUNTER — Ambulatory Visit: Payer: Commercial Managed Care - PPO | Admitting: Family Medicine

## 2021-10-01 ENCOUNTER — Telehealth: Payer: Self-pay | Admitting: *Deleted

## 2021-10-01 ENCOUNTER — Encounter: Payer: Self-pay | Admitting: Family Medicine

## 2021-10-01 ENCOUNTER — Other Ambulatory Visit: Payer: Self-pay

## 2021-10-01 VITALS — BP 150/80 | HR 80 | Temp 97.8°F | Ht 71.0 in | Wt 294.0 lb

## 2021-10-01 DIAGNOSIS — J0100 Acute maxillary sinusitis, unspecified: Secondary | ICD-10-CM | POA: Diagnosis not present

## 2021-10-01 MED ORDER — AMOXICILLIN-POT CLAVULANATE 875-125 MG PO TABS: 1.0000 | ORAL_TABLET | Freq: Two times a day (BID) | ORAL | 0 refills | Status: DC

## 2021-10-01 MED ORDER — GUAIFENESIN-CODEINE 100-10 MG/5ML PO SYRP: 5.0000 mL | ORAL_SOLUTION | Freq: Three times a day (TID) | ORAL | 0 refills | Status: DC | PRN

## 2021-10-01 NOTE — Progress Notes (Signed)
Date:  10/01/2021   Name:  David Haley   DOB:  1956/05/07   MRN:  [id number]   Chief Complaint: Sore Throat (Started with sore throat on Sunday- now is in chest)  Sinusitis This is a new problem. The current episode started in the past 7 days (sunday). The problem has been gradually worsening since onset. There has been no fever. Associated symptoms include congestion, coughing, headaches, sinus pressure, sneezing and a sore throat. Pertinent negatives include no chills, ear pain, neck pain or shortness of breath. The treatment provided moderate relief.   Lab Results  Component Value Date   CREATININE 0.98 02/18/2021   BUN 18 02/18/2021   NA 140 02/18/2021   K 4.8 02/18/2021   CL 99 02/18/2021   CO2 23 02/18/2021   Lab Results  Component Value Date   CHOL 166 02/18/2021   HDL 46 02/18/2021   LDLCALC 79 02/18/2021   TRIG 202 (H) 06/05/2021   CHOLHDL 4.0 07/25/2020   Lab Results  Component Value Date   TSH 4.130 04/10/2020   Lab Results  Component Value Date   HGBA1C 7.2 (H) 06/05/2021   Lab Results  Component Value Date   WBC 8.9 08/13/2020   HGB 14.6 08/13/2020   HCT 43.3 08/13/2020   MCV 90 08/13/2020   PLT 263 08/13/2020   Lab Results  Component Value Date   ALT 29 08/13/2020   AST 24 08/13/2020   ALKPHOS 59 08/13/2020   BILITOT 0.3 08/13/2020     Review of Systems  Constitutional:  Negative for chills and fever.  HENT:  Positive for congestion, sinus pressure, sneezing and sore throat. Negative for drooling, ear discharge and ear pain.   Respiratory:  Positive for cough. Negative for shortness of breath and wheezing.   Cardiovascular:  Negative for chest pain, palpitations and leg swelling.  Gastrointestinal:  Negative for abdominal pain, blood in stool, constipation, diarrhea and nausea.  Endocrine: Negative for polydipsia.  Genitourinary:  Negative for dysuria, frequency, hematuria and urgency.  Musculoskeletal:  Negative for back pain,  myalgias and neck pain.  Skin:  Negative for rash.  Allergic/Immunologic: Negative for environmental allergies.  Neurological:  Positive for headaches. Negative for dizziness.  Hematological:  Does not bruise/bleed easily.  Psychiatric/Behavioral:  Negative for suicidal ideas. The patient is not nervous/anxious.    Patient Active Problem List   Diagnosis Date Noted   Diabetes mellitus, type 2 (HCC) 08/26/2018   HTN (hypertension) 03/22/2014    No Known Allergies  Past Surgical History:  Procedure Laterality Date   ANKLE SURGERY Right 2008   tendon repair   APPENDECTOMY  1987   HERNIA REPAIR  2004   umbilical   HERNIA REPAIR  2014   mesh   precancerous mole Left 01/2018   shoulder   SHOULDER SURGERY Left 1992   arthroscopy   SQUAMOUS CELL CARCINOMA EXCISION Right 2017   R) arm    TONSILLECTOMY  1968    Social History   Tobacco Use   Smoking status: Never   Smokeless tobacco: Never  Substance Use Topics   Alcohol use: Yes    Comment: 5-6 /week   Drug use: Yes    Types: Marijuana     Medication list has been reviewed and updated.  Current Meds  Medication Sig   Ascorbic Acid (VITAMIN C) 1000 MG tablet Take 3,000 mg by mouth daily.   aspirin EC 81 MG tablet Take 1 tablet (81 mg total) by mouth  daily.   losartan-hydrochlorothiazide (HYZAAR) 50-12.5 MG tablet Take 1 tablet by mouth daily.   metFORMIN (GLUCOPHAGE-XR) 750 MG 24 hr tablet TAKE 1 TABLET(750 MG) BY MOUTH DAILY WITH BREAKFAST   Multiple Vitamins-Minerals (CENTRUM MEN) TABS Take 1 tablet by mouth daily.   Omega-3 1000 MG CAPS Take 1 capsule by mouth daily.    PHQ 2/9 Scores 06/05/2021 02/18/2021 10/15/2020 07/25/2020  PHQ - 2 Score 0 0 0 0  PHQ- 9 Score 0 0 0 0    GAD 7 : Generalized Anxiety Score 06/05/2021 02/18/2021 10/15/2020 07/25/2020  Nervous, Anxious, on Edge 0 0 0 0  Control/stop worrying 0 0 0 0  Worry too much - different things 0 0 0 0  Trouble relaxing 0 0 0 0  Restless 0 0 0 0  Easily  annoyed or irritable 0 0 0 0  Afraid - awful might happen 0 0 0 0  Total GAD 7 Score 0 0 0 0    BP Readings from Last 3 Encounters:  10/01/21 (!) 150/80  06/05/21 124/70  02/18/21 (!) 148/80    Physical Exam Vitals and nursing note reviewed.  HENT:     Head: Normocephalic.     Salivary Glands: Right salivary gland is not tender. Left salivary gland is not tender.     Right Ear: Tympanic membrane and external ear normal.     Left Ear: Tympanic membrane and external ear normal.     Nose: Nose normal.     Right Turbinates: Swollen.     Left Turbinates: Swollen.     Right Sinus: No maxillary sinus tenderness.     Left Sinus: No maxillary sinus tenderness.     Mouth/Throat:     Mouth: Mucous membranes are moist.     Tongue: No lesions.     Pharynx: Posterior oropharyngeal erythema present.  Eyes:     General: No scleral icterus.       Right eye: No discharge.        Left eye: No discharge.     Conjunctiva/sclera: Conjunctivae normal.     Pupils: Pupils are equal, round, and reactive to light.  Neck:     Thyroid: No thyromegaly.     Vascular: No JVD.     Trachea: No tracheal deviation.  Cardiovascular:     Rate and Rhythm: Normal rate and regular rhythm.     Heart sounds: Normal heart sounds. No murmur heard.   No friction rub. No gallop.  Pulmonary:     Effort: No respiratory distress.     Breath sounds: Normal breath sounds. No wheezing or rales.  Abdominal:     General: Bowel sounds are normal.     Palpations: Abdomen is soft. There is no mass.     Tenderness: There is no abdominal tenderness. There is no guarding or rebound.  Musculoskeletal:        General: No tenderness. Normal range of motion.     Cervical back: Normal range of motion and neck supple.  Lymphadenopathy:     Cervical: No cervical adenopathy.  Skin:    General: Skin is warm.     Findings: No rash.  Neurological:     Mental Status: He is alert and oriented to person, place, and time.     Cranial  Nerves: No cranial nerve deficit.     Deep Tendon Reflexes: Reflexes are normal and symmetric.    Wt Readings from Last 3 Encounters:  10/01/21 294 lb (133.4 kg)  06/05/21  300 lb (136.1 kg)  02/18/21 (!) 301 lb (136.5 kg)    BP (!) 150/80   Pulse 80   Temp 97.8 F (36.6 C) (Oral)   Ht 5\' 11"  (1.803 m)   Wt 294 lb (133.4 kg)   BMI 41.00 kg/m   Assessment and Plan:  1. Acute maxillary sinusitis, recurrence not specified Acute.  Persistent.  Stable.  Uncomplicated.  Patient has 72 hours of upper respiratory and has progressed to lower respiratory as well.  Lungs are clear for pneumonia however I suspect this is combination sinusitis with a bronchitis aspect as well we will treat with amoxicillin clavulanic acid 875 mg 1 twice a day and Robitussin-AC 1 teaspoon every 6 hours. - amoxicillin-clavulanate (AUGMENTIN) 875-125 MG tablet; Take 1 tablet by mouth 2 (two) times daily.  Dispense: 20 tablet; Refill: 0 - guaiFENesin-codeine (ROBITUSSIN AC) 100-10 MG/5ML syrup; Take 5 mLs by mouth 3 (three) times daily as needed for cough.  Dispense: 118 mL; Refill: 0

## 2021-10-01 NOTE — Telephone Encounter (Signed)
Pt states ATB and guaifenesin with Codeine was called in for him S/P OV with Dr.Jones today. States he was notified  this evening pharmacy "Temporarily"  closed."   Requesting meds be sent to [facility] on [address]. Dr. Yetta Barre, on call physician called, made aware, information/number of pharmacy provided.

## 2021-10-11 ENCOUNTER — Ambulatory Visit: Payer: Self-pay | Admitting: Family Medicine

## 2021-10-15 ENCOUNTER — Encounter: Payer: Self-pay | Admitting: Family Medicine

## 2021-10-15 ENCOUNTER — Other Ambulatory Visit: Payer: Self-pay

## 2021-10-15 ENCOUNTER — Ambulatory Visit (INDEPENDENT_AMBULATORY_CARE_PROVIDER_SITE_OTHER): Payer: Commercial Managed Care - PPO | Admitting: Family Medicine

## 2021-10-15 VITALS — BP 128/74 | HR 76 | Ht 71.0 in | Wt 292.0 lb

## 2021-10-15 DIAGNOSIS — E119 Type 2 diabetes mellitus without complications: Secondary | ICD-10-CM

## 2021-10-15 DIAGNOSIS — I10 Essential (primary) hypertension: Secondary | ICD-10-CM | POA: Diagnosis not present

## 2021-10-15 DIAGNOSIS — R69 Illness, unspecified: Secondary | ICD-10-CM

## 2021-10-15 DIAGNOSIS — E785 Hyperlipidemia, unspecified: Secondary | ICD-10-CM | POA: Diagnosis not present

## 2021-10-15 DIAGNOSIS — L219 Seborrheic dermatitis, unspecified: Secondary | ICD-10-CM

## 2021-10-15 DIAGNOSIS — Z0000 Encounter for general adult medical examination without abnormal findings: Secondary | ICD-10-CM | POA: Diagnosis not present

## 2021-10-15 MED ORDER — METFORMIN HCL ER 750 MG PO TB24: ORAL_TABLET | ORAL | 1 refills | Status: DC

## 2021-10-15 NOTE — Progress Notes (Addendum)
Date:  10/15/2021   Name:  David Haley   DOB:  18-Feb-1956   MRN:  [id number]   Chief Complaint: Annual Exam  Patient is a 65 year old male who presents for a comprehensive physical exam. The patient reports the following problems: bronchitis/upper resp. Health maintenance has been reviewed up to date.     Lab Results  Component Value Date   CREATININE 0.98 02/18/2021   BUN 18 02/18/2021   NA 140 02/18/2021   K 4.8 02/18/2021   CL 99 02/18/2021   CO2 23 02/18/2021   Lab Results  Component Value Date   CHOL 166 02/18/2021   HDL 46 02/18/2021   LDLCALC 79 02/18/2021   TRIG 202 (H) 06/05/2021   CHOLHDL 4.0 07/25/2020   Lab Results  Component Value Date   TSH 4.130 04/10/2020   Lab Results  Component Value Date   HGBA1C 7.2 (H) 06/05/2021   Lab Results  Component Value Date   WBC 8.9 08/13/2020   HGB 14.6 08/13/2020   HCT 43.3 08/13/2020   MCV 90 08/13/2020   PLT 263 08/13/2020   Lab Results  Component Value Date   ALT 29 08/13/2020   AST 24 08/13/2020   ALKPHOS 59 08/13/2020   BILITOT 0.3 08/13/2020   No components found for: VITD  Review of Systems  Constitutional:  Negative for chills and fever.  HENT:  Negative for drooling, ear discharge, ear pain and sore throat.   Respiratory:  Negative for cough, shortness of breath and wheezing.   Cardiovascular:  Negative for chest pain, palpitations and leg swelling.  Gastrointestinal:  Negative for abdominal pain, blood in stool, constipation, diarrhea and nausea.  Endocrine: Negative for polydipsia.  Genitourinary:  Negative for dysuria, frequency, hematuria and urgency.  Musculoskeletal:  Negative for back pain, myalgias and neck pain.  Skin:  Negative for rash.  Allergic/Immunologic: Negative for environmental allergies.  Neurological:  Negative for dizziness and headaches.  Hematological:  Does not bruise/bleed easily.  Psychiatric/Behavioral:  Negative for suicidal ideas. The patient is not  nervous/anxious.    Patient Active Problem List   Diagnosis Date Noted   Lateral epicondylitis, left elbow 01/13/2020   DDD (degenerative disc disease), lumbar 05/13/2019   Diabetes mellitus, type 2 ([place]) 08/26/2018   HTN (hypertension) 03/22/2014    No Known Allergies  Past Surgical History:  Procedure Laterality Date   ANKLE SURGERY Right 2008   tendon repair   APPENDECTOMY  1987   HERNIA REPAIR  [id number]   umbilical   HERNIA REPAIR  2014   mesh   precancerous mole Left 01/2018   shoulder   SHOULDER SURGERY Left 1992   arthroscopy   SQUAMOUS CELL CARCINOMA EXCISION Right 2017   R) arm    TONSILLECTOMY  1968    Social History   Tobacco Use   Smoking status: Never   Smokeless tobacco: Never  Substance Use Topics   Alcohol use: Yes    Comment: 5-6 /week   Drug use: Yes    Types: Marijuana     Medication list has been reviewed and updated.  Current Meds  Medication Sig   Ascorbic Acid (VITAMIN C) 1000 MG tablet Take 3,000 mg by mouth daily.   aspirin EC 81 MG tablet Take 1 tablet (81 mg total) by mouth daily.   guaiFENesin-codeine (ROBITUSSIN AC) 100-10 MG/5ML syrup Take 5 mLs by mouth 3 (three) times daily as needed for cough.   ketoconazole (NIZORAL) 2 %  shampoo Apply 1 application topically daily.   losartan-hydrochlorothiazide (HYZAAR) 50-12.5 MG tablet Take 1 tablet by mouth daily.   Multiple Vitamins-Minerals (CENTRUM MEN) TABS Take 1 tablet by mouth daily.   Omega-3 1000 MG CAPS Take 1 capsule by mouth daily.   [DISCONTINUED] metFORMIN (GLUCOPHAGE-XR) 750 MG 24 hr tablet TAKE 1 TABLET(750 MG) BY MOUTH DAILY WITH BREAKFAST    PHQ 2/9 Scores 10/15/2021 06/05/2021 02/18/2021 10/15/2020  PHQ - 2 Score 0 0 0 0  PHQ- 9 Score 0 0 0 0    GAD 7 : Generalized Anxiety Score 10/15/2021 06/05/2021 02/18/2021 10/15/2020  Nervous, Anxious, on Edge 0 0 0 0  Control/stop worrying 0 0 0 0  Worry too much - different things 0 0 0 0  Trouble relaxing 0 0 0 0  Restless 0 0  0 0  Easily annoyed or irritable 0 0 0 0  Afraid - awful might happen 0 0 0 0  Total GAD 7 Score 0 0 0 0  Anxiety Difficulty Not difficult at all - - -    BP Readings from Last 3 Encounters:  10/15/21 128/74  10/01/21 (!) 150/80  06/05/21 124/70    Physical Exam Vitals and nursing note reviewed.  HENT:     Head: Normocephalic.     Jaw: There is normal jaw occlusion.     Right Ear: Hearing, tympanic membrane, ear canal and external ear normal. There is no impacted cerumen.     Left Ear: Hearing, tympanic membrane, ear canal and external ear normal. There is no impacted cerumen.     Nose: Nose normal. No congestion or rhinorrhea.     Mouth/Throat:     Lips: Pink.     Mouth: Mucous membranes are moist.     Tongue: No lesions.     Palate: No mass.     Pharynx: Oropharynx is clear. Uvula midline.     Tonsils: No tonsillar exudate.  Eyes:     General: Lids are normal. Vision grossly intact. Gaze aligned appropriately. No scleral icterus.       Right eye: No discharge.        Left eye: No discharge.     Extraocular Movements: Extraocular movements intact.     Conjunctiva/sclera: Conjunctivae normal.     Pupils: Pupils are equal, round, and reactive to light.     Funduscopic exam:    Right eye: Red reflex present.        Left eye: Red reflex present. Neck:     Thyroid: No thyroid mass, thyromegaly or thyroid tenderness.     Vascular: No JVD.     Trachea: No tracheal deviation.  Cardiovascular:     Rate and Rhythm: Normal rate and regular rhythm.     Chest Wall: PMI is not displaced.     Pulses: Normal pulses.          Carotid pulses are 2+ on the right side and 2+ on the left side.      Radial pulses are 2+ on the right side and 2+ on the left side.       Femoral pulses are 2+ on the right side and 2+ on the left side.      Popliteal pulses are 2+ on the right side and 2+ on the left side.       Dorsalis pedis pulses are 2+ on the right side and 2+ on the left side.        Posterior tibial pulses are 2+  on the right side and 2+ on the left side.     Heart sounds: Normal heart sounds, S1 normal and S2 normal. No murmur heard. No systolic murmur is present.  No diastolic murmur is present.    No friction rub. No gallop. No S3 or S4 sounds.  Pulmonary:     Effort: No respiratory distress.     Breath sounds: Normal breath sounds. No decreased breath sounds, wheezing, rhonchi or rales.  Chest:  Breasts:    Right: Normal.     Left: Normal.  Abdominal:     General: Bowel sounds are normal.     Palpations: Abdomen is soft. There is no hepatomegaly, splenomegaly or mass.     Tenderness: There is no abdominal tenderness. There is no guarding or rebound.  Genitourinary:    Penis: Normal.      Testes: Normal.     Epididymis:     Right: Normal.     Left: Normal.  Musculoskeletal:        General: No tenderness. Normal range of motion.     Cervical back: Full passive range of motion without pain, normal range of motion and neck supple.     Right lower leg: No edema.     Left lower leg: No edema.  Lymphadenopathy:     Head:     Right side of head: No submental, submandibular or tonsillar adenopathy.     Left side of head: No submental, submandibular or tonsillar adenopathy.     Cervical: No cervical adenopathy.     Right cervical: No superficial, deep or posterior cervical adenopathy.    Left cervical: No superficial, deep or posterior cervical adenopathy.     Upper Body:     Right upper body: No supraclavicular or axillary adenopathy.     Left upper body: No supraclavicular or axillary adenopathy.  Skin:    General: Skin is warm.     Capillary Refill: Capillary refill takes less than 2 seconds.     Findings: No rash.  Neurological:     General: No focal deficit present.     Mental Status: He is alert and oriented to person, place, and time.     Cranial Nerves: Cranial nerves 2-12 are intact. No cranial nerve deficit.     Sensory: Sensation is intact.      Motor: Motor function is intact.     Deep Tendon Reflexes: Reflexes are normal and symmetric.    Wt Readings from Last 3 Encounters:  10/15/21 292 lb (132.5 kg)  10/01/21 294 lb (133.4 kg)  06/05/21 300 lb (136.1 kg)    BP 128/74 (BP Location: Left Arm, Patient Position: Sitting, Cuff Size: Large)   Pulse 76   Ht 5\' 11"  (1.803 m)   Wt 292 lb (132.5 kg)   SpO2 97%   BMI 40.73 kg/m   Assessment and Plan: Jaimen Bechtle Rayburn is a 65 y.o. male who presents today for his Complete Annual Exam. He feels well. He reports exercising . He reports he is sleeping well.  Patient's chart was reviewed for previous encounters most recent lab most recent imaging in [place].  No subjective/objective concerns.  1. Annual physical exam Immunizations are reviewed and recommendations provided.   Age appropriate screening tests are discussed. Counseling given for risk factor reduction interventions.  No subjective/objective concerns noted during HPI, review of systems, and physical exam.  We will obtain CMP and lipid panel with review of most recent labs sufficient. - Comprehensive metabolic  panel - Lipid Panel With LDL/HDL Ratio  2. Type 2 diabetes mellitus without complication, without long-term current use of insulin (HCC) Chronic.  Controlled.  Stable.  Refill Glucophage Exar 750 mg once a day.  Will check A1c - metFORMIN (GLUCOPHAGE-XR) 750 MG 24 hr tablet; TAKE 1 TABLET(750 MG) BY MOUTH DAILY WITH BREAKFAST  Dispense: 90 tablet; Refill: 1 - Hemoglobin A1c  3. Primary hypertension Chronic.  Controlled.  Stable.  Blood pressure 128/74.  Will obtain CMP for electrolytes and GFR. - Comprehensive metabolic panel  4. Hyperlipidemia, unspecified hyperlipidemia type Chronic.  Controlled.  Stable.  They will comprehensive metabolic panel and lipid panel as noted previously could be sufficient.  5. Taking medication for chronic disease Patient's home medication which may affect liver function we  will obtain AST/ALT on CMP. - Comprehensive metabolic panel  6. Seborrheic dermatitis Patient with history of seborrheic dermatitis for which we will refill ketoconazole shampoo. - ketoconazole (NIZORAL) 2 % shampoo; Apply 1 application topically daily.

## 2021-10-16 ENCOUNTER — Other Ambulatory Visit: Payer: Self-pay

## 2021-10-16 DIAGNOSIS — E119 Type 2 diabetes mellitus without complications: Secondary | ICD-10-CM

## 2021-10-16 LAB — HEMOGLOBIN A1C
Est. average glucose Bld gHb Est-mCnc: 180 mg/dL
Hgb A1c MFr Bld: 7.9 % — ABNORMAL HIGH (ref 4.8–5.6)

## 2021-10-16 LAB — COMPREHENSIVE METABOLIC PANEL
ALT: 37 IU/L (ref 0–44)
AST: 29 IU/L (ref 0–40)
Albumin/Globulin Ratio: 1.5 (ref 1.2–2.2)
Albumin: 4.4 g/dL (ref 3.8–4.8)
Alkaline Phosphatase: 54 IU/L (ref 44–121)
BUN/Creatinine Ratio: 12 (ref 10–24)
BUN: 11 mg/dL (ref 8–27)
Bilirubin Total: 0.3 mg/dL (ref 0.0–1.2)
CO2: 23 mmol/L (ref 20–29)
Calcium: 9.4 mg/dL (ref 8.6–10.2)
Chloride: 99 mmol/L (ref 96–106)
Creatinine, Ser: 0.91 mg/dL (ref 0.76–1.27)
Globulin, Total: 3 g/dL (ref 1.5–4.5)
Glucose: 175 mg/dL — ABNORMAL HIGH (ref 70–99)
Potassium: 4.5 mmol/L (ref 3.5–5.2)
Sodium: 135 mmol/L (ref 134–144)
Total Protein: 7.4 g/dL (ref 6.0–8.5)
eGFR: 95 mL/min/{1.73_m2} (ref >59)

## 2021-10-16 LAB — LIPID PANEL WITH LDL/HDL RATIO
Cholesterol, Total: 169 mg/dL (ref 100–199)
HDL: 34 mg/dL — ABNORMAL LOW (ref >39)
LDL Chol Calc (NIH): 101 mg/dL — ABNORMAL HIGH (ref 0–99)
LDL/HDL Ratio: 3 ratio (ref 0.0–3.6)
Triglycerides: 198 mg/dL — ABNORMAL HIGH (ref 0–149)
VLDL Cholesterol Cal: 34 mg/dL (ref 5–40)

## 2021-10-22 ENCOUNTER — Encounter: Payer: Self-pay | Admitting: Family Medicine

## 2021-11-06 ENCOUNTER — Telehealth: Payer: Self-pay

## 2021-11-06 ENCOUNTER — Encounter: Payer: Self-pay | Admitting: Family Medicine

## 2021-11-06 NOTE — Telephone Encounter (Signed)
Jeanmarie Plant a message about patient to check on referral for him for [facility].   She said there is no Endocrinologist in [facility] taking new patients at this time.  I wanted to let you know so you can discuss with patient or Dr. Yetta Barre where patient can go for this in the future.  Thank you!

## 2021-11-07 ENCOUNTER — Encounter: Payer: Self-pay | Admitting: Family Medicine

## 2022-01-29 LAB — HEMOGLOBIN A1C: Hemoglobin A1C: 8.4

## 2022-01-29 LAB — HM DIABETES FOOT EXAM: HM Diabetic Foot Exam: NORMAL

## 2022-02-04 ENCOUNTER — Other Ambulatory Visit: Payer: Self-pay

## 2022-02-04 ENCOUNTER — Encounter: Payer: Self-pay | Admitting: Family Medicine

## 2022-02-04 DIAGNOSIS — I10 Essential (primary) hypertension: Secondary | ICD-10-CM

## 2022-02-04 MED ORDER — LOSARTAN POTASSIUM-HCTZ 50-12.5 MG PO TABS: 1.0000 | ORAL_TABLET | Freq: Every day | ORAL | 0 refills | Status: DC

## 2022-02-04 NOTE — Progress Notes (Signed)
Sent in losartan/ HCTZ 

## 2022-02-12 ENCOUNTER — Encounter: Payer: Self-pay | Admitting: Family Medicine

## 2022-03-05 LAB — HM DIABETES EYE EXAM

## 2022-03-12 LAB — BASIC METABOLIC PANEL
BUN: 13 (ref 4–21)
Potassium: 4.3 mEq/L (ref 3.5–5.1)
Sodium: 135 — AB (ref 137–147)

## 2022-03-12 LAB — HEPATIC FUNCTION PANEL
ALT: 39 U/L (ref 10–40)
AST: 24 (ref 14–40)
Alkaline Phosphatase: 53 (ref 25–125)

## 2022-03-12 LAB — TSH: TSH: 3.3 (ref 0.41–5.90)

## 2022-03-12 LAB — LIPID PANEL
Cholesterol: 166 (ref 0–200)
HDL: 37 (ref 35–70)
LDL Cholesterol: 90
Triglycerides: 200 — AB (ref 40–160)

## 2022-03-12 LAB — CBC AND DIFFERENTIAL
HCT: 48 (ref 41–53)
Hemoglobin: 14.7 (ref 13.5–17.5)

## 2022-03-12 LAB — HEMOGLOBIN A1C: Hemoglobin A1C: 8.1

## 2022-04-14 ENCOUNTER — Encounter: Payer: Self-pay | Admitting: Family Medicine

## 2022-04-14 ENCOUNTER — Ambulatory Visit: Payer: Commercial Managed Care - PPO | Admitting: Family Medicine

## 2022-04-14 VITALS — BP 138/70 | HR 80 | Ht 71.0 in | Wt 280.0 lb

## 2022-04-14 DIAGNOSIS — I10 Essential (primary) hypertension: Secondary | ICD-10-CM

## 2022-04-14 DIAGNOSIS — E119 Type 2 diabetes mellitus without complications: Secondary | ICD-10-CM | POA: Diagnosis not present

## 2022-04-14 DIAGNOSIS — R9431 Abnormal electrocardiogram [ECG] [EKG]: Secondary | ICD-10-CM | POA: Diagnosis not present

## 2022-04-14 MED ORDER — LOSARTAN POTASSIUM-HCTZ 50-12.5 MG PO TABS: 1.0000 | ORAL_TABLET | Freq: Every day | ORAL | 1 refills | Status: DC

## 2022-04-14 NOTE — Progress Notes (Signed)
Date:  04/14/2022   Name:  David Haley   DOB:  05-23-1956   MRN:  [id number]   Chief Complaint: Hypertension  Hypertension This is a chronic problem. The current episode started more than 1 year ago. The problem has been gradually improving since onset. The problem is controlled. Pertinent negatives include no anxiety, blurred vision, chest pain, headaches, malaise/fatigue, neck pain, orthopnea, palpitations, peripheral edema, PND, shortness of breath or sweats. Risk factors for coronary artery disease include dyslipidemia and diabetes mellitus. The current treatment provides moderate improvement. There are no compliance problems.  There is no history of angina, kidney disease, CAD/MI, CVA, heart failure, left ventricular hypertrophy, PVD or retinopathy. There is no history of chronic renal disease, a hypertension causing med or renovascular disease.  Hyperlipidemia This is a new problem. The current episode started more than 1 year ago. The problem is controlled. Recent lipid tests were reviewed and are normal. Exacerbating diseases include diabetes. He has no history of chronic renal disease, hypothyroidism, liver disease, obesity or nephrotic syndrome. There are no known factors aggravating his hyperlipidemia. Pertinent negatives include no chest pain, focal sensory loss, myalgias or shortness of breath. Current antihyperlipidemic treatment includes statins. The current treatment provides moderate improvement of lipids. There are no compliance problems.  Risk factors for coronary artery disease include diabetes mellitus, dyslipidemia and hypertension.   Lab Results  Component Value Date   NA 135 10/15/2021   K 4.5 10/15/2021   CO2 23 10/15/2021   GLUCOSE 175 (H) 10/15/2021   BUN 11 10/15/2021   CREATININE 0.91 10/15/2021   CALCIUM 9.4 10/15/2021   EGFR 95 10/15/2021   GFRNONAA 84 10/15/2020   Lab Results  Component Value Date   CHOL 169 10/15/2021   HDL 34 (L) 10/15/2021    LDLCALC 101 (H) 10/15/2021   TRIG 198 (H) 10/15/2021   CHOLHDL 4.0 07/25/2020   Lab Results  Component Value Date   TSH 4.130 04/10/2020   Lab Results  Component Value Date   HGBA1C 8.4 01/29/2022   Lab Results  Component Value Date   WBC 8.9 08/13/2020   HGB 14.6 08/13/2020   HCT 43.3 08/13/2020   MCV 90 08/13/2020   PLT 263 08/13/2020   Lab Results  Component Value Date   ALT 37 10/15/2021   AST 29 10/15/2021   ALKPHOS 54 10/15/2021   BILITOT 0.3 10/15/2021   No results found for: 25OHVITD2, 25OHVITD3, VD25OH   Review of Systems  Constitutional:  Negative for chills, fatigue, fever, malaise/fatigue and unexpected weight change.  HENT:  Negative for ear discharge, ear pain and sore throat.   Eyes:  Negative for blurred vision.  Respiratory:  Negative for cough, shortness of breath and wheezing.   Cardiovascular:  Negative for chest pain, palpitations, orthopnea, leg swelling and PND.  Gastrointestinal:  Negative for abdominal pain, blood in stool, constipation, diarrhea and nausea.  Endocrine: Negative for polydipsia and polyuria.  Genitourinary:  Positive for hematuria. Negative for urgency.  Musculoskeletal:  Negative for back pain, myalgias and neck pain.  Skin:  Negative for rash.  Allergic/Immunologic: Negative for environmental allergies.  Neurological:  Negative for dizziness and headaches.  Hematological:  Does not bruise/bleed easily.  Psychiatric/Behavioral:  Negative for suicidal ideas. The patient is not nervous/anxious.    Patient Active Problem List   Diagnosis Date Noted   Lateral epicondylitis, left elbow 01/13/2020   DDD (degenerative disc disease), lumbar 05/13/2019   Diabetes mellitus, type 2 ([place]) 08/26/2018  HTN (hypertension) 03/22/2014    No Known Allergies  Past Surgical History:  Procedure Laterality Date   ANKLE SURGERY Right 2008   tendon repair   APPENDECTOMY  1987   HERNIA REPAIR  9794   umbilical   HERNIA REPAIR  2014    mesh   precancerous mole Left 01/2018   shoulder   SHOULDER SURGERY Left 1992   arthroscopy   SQUAMOUS CELL CARCINOMA EXCISION Right 2017   R) arm    TONSILLECTOMY  1968    Social History   Tobacco Use   Smoking status: Never   Smokeless tobacco: Never  Substance Use Topics   Alcohol use: Yes    Comment: 5-6 /week   Drug use: Yes    Types: Marijuana     Medication list has been reviewed and updated.  Current Meds  Medication Sig   Ascorbic Acid (VITAMIN C) 1000 MG tablet Take 3,000 mg by mouth daily.   aspirin EC 81 MG tablet Take 1 tablet (81 mg total) by mouth daily.   ketoconazole (NIZORAL) 2 % shampoo Apply 1 application topically daily.   losartan-hydrochlorothiazide (HYZAAR) 50-12.5 MG tablet Take 1 tablet by mouth daily.   metFORMIN (GLUCOPHAGE-XR) 750 MG 24 hr tablet TAKE 1 TABLET(750 MG) BY MOUTH DAILY WITH BREAKFAST   Multiple Vitamins-Minerals (CENTRUM MEN) TABS Take 1 tablet by mouth daily.   Omega-3 1000 MG CAPS Take 1 capsule by mouth daily.       04/14/2022    8:06 AM 10/15/2021    8:50 AM 06/05/2021    9:54 AM 02/18/2021    9:30 AM  GAD 7 : Generalized Anxiety Score  Nervous, Anxious, on Edge 0 0 0 0  Control/stop worrying 0 0 0 0  Worry too much - different things 0 0 0 0  Trouble relaxing 0 0 0 0  Restless 0 0 0 0  Easily annoyed or irritable 0 0 0 0  Afraid - awful might happen 0 0 0 0  Total GAD 7 Score 0 0 0 0  Anxiety Difficulty Not difficult at all Not difficult at all         04/14/2022    8:06 AM  Depression screen PHQ 2/9  Decreased Interest 0  Down, Depressed, Hopeless 0  PHQ - 2 Score 0  Altered sleeping 0  Tired, decreased energy 0  Change in appetite 0  Feeling bad or failure about yourself  0  Trouble concentrating 0  Moving slowly or fidgety/restless 0  Suicidal thoughts 0  PHQ-9 Score 0  Difficult doing work/chores Not difficult at all    BP Readings from Last 3 Encounters:  04/14/22 138/70  10/15/21 128/74   10/01/21 (!) 150/80    Physical Exam Vitals and nursing note reviewed.  HENT:     Head: Normocephalic.     Right Ear: Tympanic membrane and external ear normal. There is no impacted cerumen.     Left Ear: Tympanic membrane and external ear normal. There is no impacted cerumen.     Nose: Nose normal. No congestion.  Eyes:     General: No scleral icterus.       Right eye: No discharge.        Left eye: No discharge.     Conjunctiva/sclera: Conjunctivae normal.     Pupils: Pupils are equal, round, and reactive to light.  Neck:     Thyroid: No thyromegaly.     Vascular: No JVD.     Trachea: No  tracheal deviation.  Cardiovascular:     Rate and Rhythm: Normal rate and regular rhythm.     Heart sounds: Normal heart sounds. No murmur heard.   No friction rub. No gallop.  Pulmonary:     Effort: No respiratory distress.     Breath sounds: Normal breath sounds. No wheezing, rhonchi or rales.  Abdominal:     General: Bowel sounds are normal. There is no abdominal bruit.     Palpations: Abdomen is soft. There is no hepatomegaly or splenomegaly.     Hernia: A hernia is present. Hernia is present in the ventral area. There is no hernia in the umbilical area.  Musculoskeletal:        General: No tenderness. Normal range of motion.     Cervical back: Normal range of motion and neck supple.  Lymphadenopathy:     Cervical: No cervical adenopathy.  Skin:    General: Skin is warm.  Neurological:     Mental Status: He is alert.     Deep Tendon Reflexes: Reflexes are normal and symmetric.    Wt Readings from Last 3 Encounters:  04/14/22 280 lb (127 kg)  10/15/21 292 lb (132.5 kg)  10/01/21 294 lb (133.4 kg)    BP 138/70   Pulse 80   Ht 5' 11" (1.803 m)   Wt 280 lb (127 kg)   BMI 39.05 kg/m   Assessment and Plan:  1. Essential hypertension Chronic.  Controlled.  Stable.  Blood pressure 138/70.  Continue losartan hydrochlorothiazide 50-12 0.5 once a day.  We will recheck in 6  months. - losartan-hydrochlorothiazide (HYZAAR) 50-12.5 MG tablet; Take 1 tablet by mouth daily.  Dispense: 90 tablet; Refill: 1  2. Type 2 diabetes mellitus without complication, without long-term current use of insulin (HCC) Chronic.  Controlled.  Stable.  Currently is in early stages of his study that he may or may not been involving Ozempic.  There is been a 10 pound weight loss so far.  Patient is tolerating and pending continuance of the study will determine whether I will continue to follow his diabetes.  3. Abnormal EKG During the course of the stay patient had an EKG that was read as the following sinus rhythm at 72 bpm intervals normal patient has low QRS voltage in precordial leads which may indicate a septal myocardial infarction of indeterminate age.  Patient had similar reading with precordial leads V1 V2 having which may have been more of a S wave than a Q wave.  Therefore it is not considered ischemic concerns.  We will likely repeat this pending study material.  Patient is having no chest pain and has not had any dyspnea on exertion.  Compared to EKGs in our possession in 2015 and 2013 in particular 2013 he was noted to have low voltage with borderline and comparison is the same as previous with likely S wave rather than Q wave.  Essentially no change from EKGs that were done in 2013 and 2015.

## 2022-04-14 NOTE — Patient Instructions (Signed)

## 2022-04-22 ENCOUNTER — Telehealth: Payer: Self-pay | Admitting: Family Medicine

## 2022-04-22 DIAGNOSIS — E119 Type 2 diabetes mellitus without complications: Secondary | ICD-10-CM

## 2022-04-24 ENCOUNTER — Other Ambulatory Visit: Payer: Self-pay

## 2022-04-24 DIAGNOSIS — E119 Type 2 diabetes mellitus without complications: Secondary | ICD-10-CM

## 2022-04-24 MED ORDER — METFORMIN HCL ER 750 MG PO TB24: ORAL_TABLET | ORAL | 1 refills | Status: DC

## 2022-04-24 NOTE — Telephone Encounter (Signed)
Pt is confused about denial  metFORMIN (GLUCOPHAGE-XR) 750 MG 24 hr tablet 90 tablet 1 10/15/2021    Sig: TAKE 1 TABLET(750 MG) BY MOUTH DAILY WITH BREAKFAST   Sent to pharmacy as: metFORMIN (GLUCOPHAGE-XR) 750 MG 24 hr tablet   E-Prescribing Status: Receipt confirmed by pharmacy (10/15/2021  9:04 AM EST   On 5/31? Appears to be approved at some level? States always have been having issues with this script as [facility] does not show specific refill as epic pt states. Last visit 5/22, FU with pt to advise [phone]

## 2022-04-24 NOTE — Telephone Encounter (Signed)
Refill sent in.  KP 

## 2022-06-19 ENCOUNTER — Other Ambulatory Visit: Payer: Self-pay | Admitting: Family Medicine

## 2022-06-19 DIAGNOSIS — E119 Type 2 diabetes mellitus without complications: Secondary | ICD-10-CM

## 2022-08-04 LAB — MICROALBUMIN / CREATININE URINE RATIO: Microalb Creat Ratio: 31.8

## 2022-08-27 LAB — BASIC METABOLIC PANEL
BUN: 16 (ref 4–21)
Creatinine: 0.9 (ref 0.6–1.3)
Glucose: 93
Potassium: 4.8 mEq/L (ref 3.5–5.1)
Sodium: 136 — AB (ref 137–147)

## 2022-08-27 LAB — HEPATIC FUNCTION PANEL
ALT: 18 U/L (ref 10–40)
AST: 15 (ref 14–40)
Alkaline Phosphatase: 69 (ref 25–125)
Bilirubin, Direct: 0.22 (ref 0.01–0.4)
Bilirubin, Total: 0.5

## 2022-08-27 LAB — LIPID PANEL
Cholesterol: 112 (ref 0–200)
HDL: 36 (ref 35–70)
LDL Cholesterol: 61
Triglycerides: 77 (ref 40–160)

## 2022-08-27 LAB — HEMOGLOBIN A1C: Hemoglobin A1C: 5.3

## 2022-08-27 LAB — COMPREHENSIVE METABOLIC PANEL
Calcium: 9.5 (ref 8.7–10.7)
eGFR: 96

## 2022-08-27 LAB — CBC AND DIFFERENTIAL
HCT: 45 (ref 41–53)
Hemoglobin: 14.2 (ref 13.5–17.5)

## 2022-08-28 ENCOUNTER — Encounter: Payer: Self-pay | Admitting: Family Medicine

## 2022-09-17 ENCOUNTER — Other Ambulatory Visit: Payer: Self-pay | Admitting: Family Medicine

## 2022-09-17 DIAGNOSIS — E119 Type 2 diabetes mellitus without complications: Secondary | ICD-10-CM

## 2022-10-06 ENCOUNTER — Encounter: Payer: Self-pay | Admitting: Family Medicine

## 2022-10-15 ENCOUNTER — Ambulatory Visit: Payer: Commercial Managed Care - PPO | Admitting: Family Medicine

## 2022-10-15 ENCOUNTER — Other Ambulatory Visit: Payer: Self-pay

## 2022-10-15 DIAGNOSIS — E119 Type 2 diabetes mellitus without complications: Secondary | ICD-10-CM

## 2022-10-15 MED ORDER — METFORMIN HCL ER 750 MG PO TB24: ORAL_TABLET | ORAL | 0 refills | Status: DC

## 2022-10-20 ENCOUNTER — Ambulatory Visit (INDEPENDENT_AMBULATORY_CARE_PROVIDER_SITE_OTHER): Payer: Commercial Managed Care - PPO | Admitting: Family Medicine

## 2022-10-20 ENCOUNTER — Encounter: Payer: Self-pay | Admitting: Family Medicine

## 2022-10-20 VITALS — BP 120/62 | HR 84 | Ht 71.0 in | Wt 238.0 lb

## 2022-10-20 DIAGNOSIS — R351 Nocturia: Secondary | ICD-10-CM | POA: Diagnosis not present

## 2022-10-20 DIAGNOSIS — Z0000 Encounter for general adult medical examination without abnormal findings: Secondary | ICD-10-CM | POA: Diagnosis not present

## 2022-10-20 LAB — HEMOCCULT GUIAC POC 1CARD (OFFICE): Fecal Occult Blood, POC: NEGATIVE

## 2022-10-20 NOTE — Patient Instructions (Signed)

## 2022-10-20 NOTE — Progress Notes (Signed)
Date:  10/20/2022   Name:  David Haley   DOB:  1956/04/27   MRN:  [id number]   Chief Complaint: Annual Exam  Patient is a 66 year old male who presents for a comprehensive physical exam. The patient reports the following problems: none. Health maintenance has been reviewed up to date.      Lab Results  Component Value Date   NA 136 (A) 08/27/2022   K 4.8 08/27/2022   CO2 23 10/15/2021   GLUCOSE 175 (H) 10/15/2021   BUN 16 08/27/2022   CREATININE 0.9 08/27/2022   CALCIUM 9.5 08/27/2022   EGFR 96 08/27/2022   GFRNONAA 84 10/15/2020   Lab Results  Component Value Date   CHOL 112 08/27/2022   HDL 36 08/27/2022   LDLCALC 61 08/27/2022   TRIG 77 08/27/2022   CHOLHDL 4.0 07/25/2020   Lab Results  Component Value Date   TSH 3.30 03/12/2022   Lab Results  Component Value Date   HGBA1C 5.3 08/27/2022   Lab Results  Component Value Date   WBC 8.9 08/13/2020   HGB 14.2 08/27/2022   HCT 45 08/27/2022   MCV 90 08/13/2020   PLT 263 08/13/2020   Lab Results  Component Value Date   ALT 18 08/27/2022   AST 15 08/27/2022   ALKPHOS 69 08/27/2022   BILITOT 0.3 10/15/2021   No results found for: "25OHVITD2", "25OHVITD3", "VD25OH"   Review of Systems  Constitutional:  Negative for chills and fever.  HENT:  Negative for drooling, ear discharge, ear pain and sore throat.   Respiratory:  Negative for cough, shortness of breath and wheezing.   Cardiovascular:  Negative for chest pain, palpitations and leg swelling.  Gastrointestinal:  Negative for abdominal pain, blood in stool, constipation, diarrhea and nausea.  Endocrine: Negative for polydipsia.  Genitourinary:  Negative for dysuria, frequency, hematuria and urgency.  Musculoskeletal:  Negative for back pain, myalgias and neck pain.  Skin:  Negative for rash.  Allergic/Immunologic: Negative for environmental allergies.  Neurological:  Negative for dizziness and headaches.  Hematological:  Does not bruise/bleed  easily.  Psychiatric/Behavioral:  Negative for suicidal ideas. The patient is not nervous/anxious.     Patient Active Problem List   Diagnosis Date Noted   Lateral epicondylitis, left elbow 01/13/2020   DDD (degenerative disc disease), lumbar 05/13/2019   Diabetes mellitus, type 2 ([place]) 08/26/2018   HTN (hypertension) 03/22/2014    No Known Allergies  Past Surgical History:  Procedure Laterality Date   ANKLE SURGERY Right 2008   tendon repair   APPENDECTOMY  1987   HERNIA REPAIR  7124   umbilical   HERNIA REPAIR  2014   mesh   precancerous mole Left 01/2018   shoulder   SHOULDER SURGERY Left 1992   arthroscopy   SQUAMOUS CELL CARCINOMA EXCISION Right 2017   R) arm    TONSILLECTOMY  1968    Social History   Tobacco Use   Smoking status: Never   Smokeless tobacco: Never  Substance Use Topics   Alcohol use: Yes    Comment: 5-6 /week   Drug use: Yes    Types: Marijuana     Medication list has been reviewed and updated.  Current Meds  Medication Sig   Ascorbic Acid (VITAMIN C) 1000 MG tablet Take 3,000 mg by mouth daily.   ASPIRIN LOW DOSE 81 MG tablet TAKE 1 TABLET(81 MG) BY MOUTH DAILY   co-enzyme Q-10 30 MG capsule Take 200 mg  by mouth daily.   ketoconazole (NIZORAL) 2 % shampoo Apply 1 application topically daily.   losartan-hydrochlorothiazide (HYZAAR) 50-12.5 MG tablet Take 1 tablet by mouth daily.   metFORMIN (GLUCOPHAGE-XR) 750 MG 24 hr tablet TAKE 1 TABLET(750 MG) BY MOUTH DAILY WITH BREAKFAST   Multiple Vitamins-Minerals (CENTRUM MEN) TABS Take 1 tablet by mouth daily.       10/20/2022    8:28 AM 04/14/2022    8:06 AM 10/15/2021    8:50 AM 06/05/2021    9:54 AM  GAD 7 : Generalized Anxiety Score  Nervous, Anxious, on Edge 0 0 0 0  Control/stop worrying 0 0 0 0  Worry too much - different things 0 0 0 0  Trouble relaxing 0 0 0 0  Restless 0 0 0 0  Easily annoyed or irritable 0 0 0 0  Afraid - awful might happen 0 0 0 0  Total GAD 7 Score 0 0 0  0  Anxiety Difficulty Not difficult at all Not difficult at all Not difficult at all        10/20/2022    8:28 AM 04/14/2022    8:06 AM 10/15/2021    8:50 AM  Depression screen PHQ 2/9  Decreased Interest 0 0 0  Down, Depressed, Hopeless 0 0 0  PHQ - 2 Score 0 0 0  Altered sleeping 0 0 0  Tired, decreased energy 0 0 0  Change in appetite 0 0 0  Feeling bad or failure about yourself  0 0 0  Trouble concentrating 0 0 0  Moving slowly or fidgety/restless 0 0 0  Suicidal thoughts 0 0 0  PHQ-9 Score 0 0 0  Difficult doing work/chores Not difficult at all Not difficult at all Not difficult at all    BP Readings from Last 3 Encounters:  10/20/22 120/62  04/14/22 138/70  10/15/21 128/74    Physical Exam Vitals and nursing note reviewed.  Constitutional:      Appearance: Normal appearance. He is obese.  HENT:     Head: Normocephalic.     Jaw: There is normal jaw occlusion.     Right Ear: Hearing, tympanic membrane, ear canal and external ear normal.     Left Ear: Hearing, tympanic membrane, ear canal and external ear normal.     Nose: Nose normal.     Mouth/Throat:     Mouth: Mucous membranes are moist.  Eyes:     General: Lids are normal. Vision grossly intact. Gaze aligned appropriately. No scleral icterus.       Right eye: No discharge.        Left eye: No discharge.     Extraocular Movements: Extraocular movements intact.     Conjunctiva/sclera: Conjunctivae normal.     Pupils: Pupils are equal, round, and reactive to light.  Neck:     Thyroid: No thyroid mass, thyromegaly or thyroid tenderness.     Vascular: Normal carotid pulses. No carotid bruit, hepatojugular reflux or JVD.     Trachea: Trachea and phonation normal. No tracheal deviation.  Cardiovascular:     Rate and Rhythm: Normal rate and regular rhythm.     Chest Wall: PMI is not displaced.     Pulses: Normal pulses.          Carotid pulses are 2+ on the right side and 2+ on the left side.      Radial  pulses are 2+ on the right side and 2+ on the left side.  Femoral pulses are 2+ on the right side and 2+ on the left side.      Popliteal pulses are 2+ on the right side and 2+ on the left side.       Dorsalis pedis pulses are 2+ on the right side and 2+ on the left side.       Posterior tibial pulses are 2+ on the right side and 2+ on the left side.     Heart sounds: Normal heart sounds, S1 normal and S2 normal. No murmur heard.    No systolic murmur is present.     No diastolic murmur is present.     No friction rub. No gallop. No S3 or S4 sounds.  Pulmonary:     Effort: Pulmonary effort is normal. No respiratory distress.     Breath sounds: Normal breath sounds. No decreased breath sounds, wheezing, rhonchi or rales.  Chest:  Breasts:    Right: Normal.     Left: Normal.  Abdominal:     General: Bowel sounds are normal.     Palpations: Abdomen is soft. There is no hepatomegaly, splenomegaly or mass.     Tenderness: There is no abdominal tenderness. There is no guarding or rebound.     Hernia: No hernia is present. There is no hernia in the umbilical area or ventral area.  Genitourinary:    Penis: Normal.      Testes: Normal.     Epididymis:     Right: Normal.     Left: Normal.     Prostate: Normal. Not enlarged, not tender and no nodules present.     Rectum: Normal. Guaiac result negative. No mass.  Musculoskeletal:        General: No tenderness. Normal range of motion.     Cervical back: Full passive range of motion without pain, normal range of motion and neck supple.     Right lower leg: No edema.     Left lower leg: No edema.  Lymphadenopathy:     Cervical: No cervical adenopathy.     Right cervical: No superficial, deep or posterior cervical adenopathy.    Left cervical: No superficial, deep or posterior cervical adenopathy.     Upper Body:     Right upper body: No supraclavicular or axillary adenopathy.     Left upper body: No supraclavicular or axillary  adenopathy.  Skin:    General: Skin is warm.     Capillary Refill: Capillary refill takes less than 2 seconds.     Findings: No rash.  Neurological:     General: No focal deficit present.     Mental Status: He is alert and oriented to person, place, and time.     Cranial Nerves: Cranial nerves 2-12 are intact. No cranial nerve deficit.     Sensory: Sensation is intact.     Motor: Motor function is intact.     Deep Tendon Reflexes: Reflexes are normal and symmetric.     Reflex Scores:      Tricep reflexes are 2+ on the right side and 2+ on the left side.      Bicep reflexes are 2+ on the right side and 2+ on the left side.      Brachioradialis reflexes are 2+ on the right side and 2+ on the left side.      Patellar reflexes are 2+ on the right side and 2+ on the left side.      Achilles reflexes are 2+ on the  right side and 2+ on the left side. Psychiatric:        Behavior: Behavior is cooperative.     Wt Readings from Last 3 Encounters:  10/20/22 238 lb (108 kg)  04/14/22 280 lb (127 kg)  10/15/21 292 lb (132.5 kg)    BP 120/62   Pulse 84   Ht _0  (1.803 m)   Wt 238 lb (108 kg)   SpO2 95%   BMI 33.19 kg/m   Assessment and Plan: David Haley is a 66 y.o. male who presents today for his Complete Annual Exam. He feels fairly well. He reports exercising . He reports he is sleeping fairly well.  Immunizations are reviewed and recommendations provided.   Age appropriate screening tests are discussed. Counseling given for risk factor reduction interventions.  1. Annual physical exam No subjective/objective concerns noted during HPI, review of past medical history and medications, review of systems, and physical exam. - PSA - POCT Occult Blood Stool  2. Nocturia Patient has 1 nocturia episode per night and we will evaluate PSA.  DRE was done and it was noted to be unremarkable. - PSA    Otilio Miu, MD

## 2022-10-21 LAB — PSA: Prostate Specific Ag, Serum: 0.5 ng/mL (ref 0.0–4.0)

## 2022-11-10 ENCOUNTER — Ambulatory Visit: Payer: Commercial Managed Care - PPO | Admitting: Family Medicine

## 2022-11-20 ENCOUNTER — Other Ambulatory Visit: Payer: Self-pay

## 2022-11-20 DIAGNOSIS — I10 Essential (primary) hypertension: Secondary | ICD-10-CM

## 2022-11-20 MED ORDER — LOSARTAN POTASSIUM-HCTZ 50-12.5 MG PO TABS: 1.0000 | ORAL_TABLET | Freq: Every day | ORAL | 0 refills | Status: DC

## 2022-12-16 ENCOUNTER — Other Ambulatory Visit: Payer: Self-pay

## 2022-12-16 DIAGNOSIS — E119 Type 2 diabetes mellitus without complications: Secondary | ICD-10-CM

## 2022-12-16 MED ORDER — ASPIRIN 81 MG PO TBEC: DELAYED_RELEASE_TABLET | ORAL | 0 refills | Status: DC

## 2022-12-19 LAB — HM DIABETES EYE EXAM

## 2023-02-02 ENCOUNTER — Other Ambulatory Visit: Payer: Self-pay | Admitting: Family Medicine

## 2023-02-02 DIAGNOSIS — I10 Essential (primary) hypertension: Secondary | ICD-10-CM

## 2023-02-03 NOTE — Telephone Encounter (Signed)
Requested Prescriptions  Pending Prescriptions Disp Refills   losartan-hydrochlorothiazide (HYZAAR) 50-12.5 MG tablet [Pharmacy Med Name: LOSARTAN/HCTZ 50/12.'5MG'$  TABLETS] 90 tablet 0    Sig: TAKE 1 TABLET BY MOUTH DAILY     Cardiovascular: ARB + Diuretic Combos Failed - 02/02/2023 10:53 PM      Failed - Na in normal range and within 180 days    Sodium  Date Value Ref Range Status  08/27/2022 136 (A) 137 - 147 Final  12/22/2013 132 (L) 136 - 145 mmol/L Final         Passed - K in normal range and within 180 days    Potassium  Date Value Ref Range Status  08/27/2022 4.8 3.5 - 5.1 mEq/L Final  12/22/2013 4.1 3.5 - 5.1 mmol/L Final         Passed - Cr in normal range and within 180 days    Creatinine  Date Value Ref Range Status  08/27/2022 0.9 0.6 - 1.3 Final  12/22/2013 0.99 0.60 - 1.30 mg/dL Final   Creatinine, Ser  Date Value Ref Range Status  10/15/2021 0.91 0.76 - 1.27 mg/dL Final         Passed - eGFR is 10 or above and within 180 days    EGFR (African American)  Date Value Ref Range Status  12/22/2013 >60  Final   GFR calc Af Amer  Date Value Ref Range Status  10/15/2020 98 >59 mL/min/1.73 Final    Comment:    **In accordance with recommendations from the NKF-ASN Task force,**   [facility] is in the process of updating its eGFR calculation to the   2021 CKD-EPI creatinine equation that estimates kidney function   without a race variable.    EGFR (Non-African Amer.)  Date Value Ref Range Status  12/22/2013 >60  Final    Comment:    eGFR values <70m/min/1.73 m2 may be an indication of chronic kidney disease (CKD). Calculated eGFR is useful in patients with stable renal function. The eGFR calculation will not be reliable in acutely ill patients when serum creatinine is changing rapidly. It is not useful in  patients on dialysis. The eGFR calculation may not be applicable to patients at the low and high extremes of body sizes, pregnant women, and vegetarians.     GFR calc non Af Amer  Date Value Ref Range Status  10/15/2020 84 >59 mL/min/1.73 Final   eGFR  Date Value Ref Range Status  08/27/2022 96  Final  10/15/2021 95 >59 mL/min/1.73 Final         Passed - Patient is not pregnant      Passed - Last BP in normal range    BP Readings from Last 1 Encounters:  10/20/22 120/62         Passed - Valid encounter within last 6 months    Recent Outpatient Visits           3 months ago Annual physical exam   [facility]   9 months ago Essential hypertension   [facility]   1 year [facility]   1 year ago Acute maxillary sinusitis, recurrence not specified   [facility] [place]   1 year  ago Type 2 diabetes mellitus without complication, without long-term current use of insulin ([place])    [facility] [place], [place], MD       Future Appointments             In 2 months Juline Patch, MD [place] [facility] [place]   In 8 months Juline Patch, MD [place] at [facility]

## 2023-02-05 ENCOUNTER — Other Ambulatory Visit: Payer: Self-pay | Admitting: Family Medicine

## 2023-02-05 ENCOUNTER — Encounter: Payer: Self-pay | Admitting: Family Medicine

## 2023-02-05 DIAGNOSIS — E119 Type 2 diabetes mellitus without complications: Secondary | ICD-10-CM

## 2023-02-05 MED ORDER — METFORMIN HCL ER 750 MG PO TB24: ORAL_TABLET | ORAL | 0 refills | Status: DC

## 2023-02-10 ENCOUNTER — Other Ambulatory Visit: Payer: Self-pay

## 2023-02-10 DIAGNOSIS — E119 Type 2 diabetes mellitus without complications: Secondary | ICD-10-CM

## 2023-02-10 MED ORDER — METFORMIN HCL ER 750 MG PO TB24: ORAL_TABLET | ORAL | 0 refills | Status: DC

## 2023-03-16 ENCOUNTER — Other Ambulatory Visit: Payer: Self-pay | Admitting: Family Medicine

## 2023-03-16 DIAGNOSIS — E119 Type 2 diabetes mellitus without complications: Secondary | ICD-10-CM

## 2023-03-17 NOTE — Telephone Encounter (Signed)
Requested Prescriptions  Pending Prescriptions Disp Refills   aspirin EC (ASPIRIN LOW DOSE) 81 MG tablet [Pharmacy Med Name: ASPIRIN  EC LOW DOSE TABLETS] 90 tablet 2    Sig: TAKE 1 TABLET BY MOUTH ONCE DAILY     Analgesics:  NSAIDS - aspirin Passed - 03/16/2023  6:20 AM      Passed - Cr in normal range and within 360 days    Creatinine  Date Value Ref Range Status  08/27/2022 0.9 0.6 - 1.3 Final  12/22/2013 0.99 0.60 - 1.30 mg/dL Final   Creatinine, Ser  Date Value Ref Range Status  10/15/2021 0.91 0.76 - 1.27 mg/dL Final         Passed - eGFR is 10 or above and within 360 days    EGFR (African American)  Date Value Ref Range Status  12/22/2013 >60  Final   GFR calc Af Amer  Date Value Ref Range Status  10/15/2020 98 >59 mL/min/1.73 Final    Comment:    **In accordance with recommendations from the NKF-ASN Task force,**   [facility] is in the process of updating its eGFR calculation to the   2021 CKD-EPI creatinine equation that estimates kidney function   without a race variable.    EGFR (Non-African Amer.)  Date Value Ref Range Status  12/22/2013 >60  Final    Comment:    eGFR values <40mL/min/1.73 m2 may be an indication of chronic kidney disease (CKD). Calculated eGFR is useful in patients with stable renal function. The eGFR calculation will not be reliable in acutely ill patients when serum creatinine is changing rapidly. It is not useful in  patients on dialysis. The eGFR calculation may not be applicable to patients at the low and high extremes of body sizes, pregnant women, and vegetarians.    GFR calc non Af Amer  Date Value Ref Range Status  10/15/2020 84 >59 mL/min/1.73 Final   eGFR  Date Value Ref Range Status  08/27/2022 96  Final  10/15/2021 95 >59 mL/min/1.73 Final         Passed - Patient is not pregnant      Passed - Valid encounter within last 12 months    Recent Outpatient Visits           4 months ago Annual physical exam   [facility]   11 months ago Essential hypertension   [place] [facility]   1 year ago Annual physical exam   [facility]   1 year ago Acute maxillary sinusitis, recurrence not specified   [place] [facility], [facility]   1 year ago Type 2 diabetes mellitus without complication, without long-term current use of insulin [facility])   [place] [facility]       Future Appointments             In 1 month Duanne Limerick, MD [facility] at [facility]   In 7 months Duanne Limerick, MD [facility] at [facility], [facility]

## 2023-03-31 LAB — HEMOGLOBIN A1C: Hemoglobin A1C: 5.3

## 2023-03-31 LAB — HM DIABETES FOOT EXAM: HM Diabetic Foot Exam: NORMAL

## 2023-04-16 ENCOUNTER — Other Ambulatory Visit: Payer: Self-pay | Admitting: Family Medicine

## 2023-04-16 DIAGNOSIS — I10 Essential (primary) hypertension: Secondary | ICD-10-CM

## 2023-04-16 NOTE — Telephone Encounter (Signed)
Rx 02/03/23 #90- too soon Requested Prescriptions  Pending Prescriptions Disp Refills   losartan-hydrochlorothiazide (HYZAAR) 50-12.5 MG tablet [Pharmacy Med Name: LOSARTAN/HCTZ 50/12.5MG  TABLETS] 90 tablet 0    Sig: TAKE 1 TABLET BY MOUTH DAILY     Cardiovascular: ARB + Diuretic Combos Failed - 04/16/2023  6:22 AM      Failed - K in normal range and within 180 days    Potassium  Date Value Ref Range Status  08/27/2022 4.8 3.5 - 5.1 mEq/L Final  12/22/2013 4.1 3.5 - 5.1 mmol/L Final         Failed - Na in normal range and within 180 days    Sodium  Date Value Ref Range Status  08/27/2022 136 (A) 137 - 147 Final  12/22/2013 132 (L) 136 - 145 mmol/L Final         Failed - Cr in normal range and within 180 days    Creatinine  Date Value Ref Range Status  08/27/2022 0.9 0.6 - 1.3 Final  12/22/2013 0.99 0.60 - 1.30 mg/dL Final   Creatinine, Ser  Date Value Ref Range Status  10/15/2021 0.91 0.76 - 1.27 mg/dL Final         Failed - eGFR is 10 or above and within 180 days    EGFR (African American)  Date Value Ref Range Status  12/22/2013 >60  Final   GFR calc Af Amer  Date Value Ref Range Status  10/15/2020 98 >59 mL/min/1.73 Final    Comment:    **In accordance with recommendations from the NKF-ASN Task force,**   [facility] is in the process of updating its eGFR calculation to the   2021 CKD-EPI creatinine equation that estimates kidney function   without a race variable.    EGFR (Non-African Amer.)  Date Value Ref Range Status  12/22/2013 >60  Final    Comment:    eGFR values <72mL/min/1.73 m2 may be an indication of chronic kidney disease (CKD). Calculated eGFR is useful in patients with stable renal function. The eGFR calculation will not be reliable in acutely ill patients when serum creatinine is changing rapidly. It is not useful in  patients on dialysis. The eGFR calculation may not be applicable to patients at the low and high extremes of body sizes,  pregnant women, and vegetarians.    GFR calc non Af Amer  Date Value Ref Range Status  10/15/2020 84 >59 mL/min/1.73 Final   eGFR  Date Value Ref Range Status  08/27/2022 96  Final  10/15/2021 95 >59 mL/min/1.73 Final         Passed - Patient is not pregnant      Passed - Last BP in normal range    BP Readings from Last 1 Encounters:  10/20/22 120/62         Passed - Valid encounter within last 6 months    Recent Outpatient Visits           5 months ago Annual physical exam   [place] [facility]   1 year ago Essential hypertension   [place] [facility]   1 year ago Annual physical exam   [facility], [facility]   1 year ago Acute maxillary sinusitis, recurrence not specified   [facility] at [facility] [place]  C, MD   1 year ago Type 2 diabetes mellitus without complication, without long-term current use of insulin (HCC)   [place] [facility], [facility]       Future Appointments             In 5 days Duanne Limerick, MD [facility] at [facility], [facility]   In 6 months Duanne Limerick, MD [facility] at [facility], [facility]

## 2023-04-21 ENCOUNTER — Ambulatory Visit: Payer: Commercial Managed Care - PPO | Admitting: Family Medicine

## 2023-04-21 ENCOUNTER — Encounter: Payer: Self-pay | Admitting: Family Medicine

## 2023-04-21 VITALS — BP 104/64 | HR 86 | Ht 71.0 in | Wt 227.0 lb

## 2023-04-21 DIAGNOSIS — I10 Essential (primary) hypertension: Secondary | ICD-10-CM

## 2023-04-21 MED ORDER — LOSARTAN POTASSIUM-HCTZ 50-12.5 MG PO TABS: 1.0000 | ORAL_TABLET | Freq: Every day | ORAL | 1 refills | Status: DC

## 2023-04-21 NOTE — Progress Notes (Signed)
Date:  04/21/2023   Name:  David Haley   DOB:  [birth year]   MRN:  [id number]   Chief Complaint: Hypertension  Hypertension This is a chronic problem. The current episode started more than 1 year ago. The problem has been gradually improving since onset. The problem is controlled. Pertinent negatives include no anxiety, chest pain, orthopnea, palpitations or shortness of breath. There are no associated agents to hypertension. Risk factors for coronary artery disease include diabetes mellitus. Past treatments include angiotensin blockers and diuretics. The current treatment provides moderate improvement. There are no compliance problems.  There is no history of CAD/MI or CVA. There is no history of chronic renal disease, a hypertension causing med or renovascular disease.    Lab Results  Component Value Date   NA 136 (A) 08/27/2022   K 4.8 08/27/2022   CO2 23 10/15/2021   GLUCOSE 175 (H) 10/15/2021   BUN 16 08/27/2022   CREATININE 0.9 08/27/2022   CALCIUM 9.5 08/27/2022   EGFR 96 08/27/2022   GFRNONAA 84 10/15/2020   Lab Results  Component Value Date   CHOL 112 08/27/2022   HDL 36 08/27/2022   LDLCALC 61 08/27/2022   TRIG 77 08/27/2022   CHOLHDL 4.0 07/25/2020   Lab Results  Component Value Date   TSH 3.30 03/12/2022   Lab Results  Component Value Date   HGBA1C 5.3 03/31/2023   Lab Results  Component Value Date   WBC 8.9 08/13/2020   HGB 14.2 08/27/2022   HCT 45 08/27/2022   MCV 90 08/13/2020   PLT 263 08/13/2020   Lab Results  Component Value Date   ALT 18 08/27/2022   AST 15 08/27/2022   ALKPHOS 69 08/27/2022   BILITOT 0.3 10/15/2021   No results found for: "25OHVITD2", "25OHVITD3", "VD25OH"   Review of Systems  Constitutional:  Negative for fatigue and unexpected weight change.  HENT:  Negative for trouble swallowing.   Respiratory:  Negative for chest tightness, shortness of breath and wheezing.   Cardiovascular:  Negative for chest pain,  palpitations and orthopnea.  Gastrointestinal:  Negative for abdominal pain, blood in stool and nausea.  Genitourinary:  Negative for difficulty urinating and hematuria.  Skin:  Negative for color change.  Neurological:        Paresthesias  Hematological:  Negative for adenopathy. Does not bruise/bleed easily.    Patient Active Problem List   Diagnosis Date Noted   Lateral epicondylitis, left elbow 01/13/2020   DDD (degenerative disc disease), lumbar 05/13/2019   Diabetes mellitus, type 2 (HCC) 08/26/2018   HTN (hypertension) 03/22/2014    No Known Allergies  Past Surgical History:  Procedure Laterality Date   ANKLE SURGERY Right 2008   tendon repair   APPENDECTOMY  1987   HERNIA REPAIR  2004   umbilical   HERNIA REPAIR  2014   mesh   precancerous mole Left 01/2018   shoulder   SHOULDER SURGERY Left 1992   arthroscopy   SQUAMOUS CELL CARCINOMA EXCISION Right 2017   R) arm    TONSILLECTOMY  1968    Social History   Tobacco Use   Smoking status: Never   Smokeless tobacco: Never  Substance Use Topics   Alcohol use: Yes    Comment: 5-6 /week   Drug use: Yes    Types: Marijuana     Medication list has been reviewed and updated.  Current Meds  Medication Sig   Ascorbic Acid (VITAMIN C) 1000 MG tablet Take  3,000 mg by mouth daily.   aspirin EC (ASPIRIN LOW DOSE) 81 MG tablet TAKE 1 TABLET BY MOUTH ONCE DAILY   atorvastatin (LIPITOR) 20 MG tablet Take 20 mg by mouth daily. endo   ketoconazole (NIZORAL) 2 % shampoo Apply 1 application topically daily.   losartan-hydrochlorothiazide (HYZAAR) 50-12.5 MG tablet TAKE 1 TABLET BY MOUTH DAILY   metFORMIN (GLUCOPHAGE-XR) 750 MG 24 hr tablet TAKE 1 TABLET(750 MG) BY MOUTH DAILY WITH BREAKFAST   Multiple Vitamins-Minerals (CENTRUM MEN) TABS Take 1 tablet by mouth daily.   Omega-3 1000 MG CAPS Take 1 capsule by mouth daily.   [DISCONTINUED] co-enzyme Q-10 30 MG capsule Take 200 mg by mouth daily.       04/21/2023     8:28 AM 10/20/2022    8:28 AM 04/14/2022    8:06 AM 10/15/2021    8:50 AM  GAD 7 : Generalized Anxiety Score  Nervous, Anxious, on Edge 0 0 0 0  Control/stop worrying 0 0 0 0  Worry too much - different things 0 0 0 0  Trouble relaxing 0 0 0 0  Restless 0 0 0 0  Easily annoyed or irritable 0 0 0 0  Afraid - awful might happen 0 0 0 0  Total GAD 7 Score 0 0 0 0  Anxiety Difficulty Not difficult at all Not difficult at all Not difficult at all Not difficult at all       04/21/2023    8:28 AM 10/20/2022    8:28 AM 04/14/2022    8:06 AM  Depression screen PHQ 2/9  Decreased Interest 0 0 0  Down, Depressed, Hopeless 0 0 0  PHQ - 2 Score 0 0 0  Altered sleeping 0 0 0  Tired, decreased energy 0 0 0  Change in appetite 0 0 0  Feeling bad or failure about yourself  0 0 0  Trouble concentrating 0 0 0  Moving slowly or fidgety/restless 0 0 0  Suicidal thoughts 0 0 0  PHQ-9 Score 0 0 0  Difficult doing work/chores Not difficult at all Not difficult at all Not difficult at all    BP Readings from Last 3 Encounters:  04/21/23 104/64  10/20/22 120/62  04/14/22 138/70    Physical Exam Vitals and nursing note reviewed.  HENT:     Head: Normocephalic.     Right Ear: Tympanic membrane and external ear normal.     Left Ear: Tympanic membrane and external ear normal.     Nose: Nose normal.  Eyes:     General: No scleral icterus.       Right eye: No discharge.        Left eye: No discharge.     Conjunctiva/sclera: Conjunctivae normal.     Pupils: Pupils are equal, round, and reactive to light.  Neck:     Thyroid: No thyromegaly.     Vascular: No JVD.     Trachea: No tracheal deviation.  Cardiovascular:     Rate and Rhythm: Normal rate and regular rhythm.     Heart sounds: Normal heart sounds, S1 normal and S2 normal. No murmur heard.    No systolic murmur is present.     No diastolic murmur is present.     No friction rub. No gallop. No S3 or S4 sounds.  Pulmonary:      Effort: No respiratory distress.     Breath sounds: Normal breath sounds. No wheezing or rales.  Abdominal:  General: Bowel sounds are normal.     Palpations: Abdomen is soft. There is no mass.     Tenderness: There is no abdominal tenderness. There is no guarding or rebound.  Musculoskeletal:        General: No tenderness. Normal range of motion.     Cervical back: Normal range of motion and neck supple.     Right lower leg: No edema.     Left lower leg: No edema.  Lymphadenopathy:     Cervical: No cervical adenopathy.  Skin:    General: Skin is warm.     Findings: No rash.  Neurological:     Mental Status: He is alert and oriented to person, place, and time.     Cranial Nerves: No cranial nerve deficit.     Deep Tendon Reflexes: Reflexes are normal and symmetric.     Wt Readings from Last 3 Encounters:  04/21/23 227 lb (103 kg)  10/20/22 238 lb (108 kg)  04/14/22 280 lb (127 kg)    BP 104/64   Pulse 86   Ht 5\' 11"  (1.803 m)   Wt 227 lb (103 kg)   SpO2 98%   BMI 31.66 kg/m   Assessment and Plan:  1. Essential hypertension Chronic.  Controlled.  Stable.  Blood pressure 104/64.  Asymptomatic.  Tolerating medication well.  Patient is in a study right now for his diabetes which is probably a higher dosing of GLP-1.  We will not make any changes some blood pressure but we will leave it to the discretion of the study if it needs to be dropped.  Will recheck in 6 months.  In the meantime we will continue his losartan hydrochlorothiazide 50-12.5 mg once a day.  Will recheck in 6 months.  Patient will send labs of CMP/renal function from study for [place] to abstract. - losartan-hydrochlorothiazide (HYZAAR) 50-12.5 MG tablet; Take 1 tablet by mouth daily.  Dispense: 90 tablet; Refill: 1    Elizabeth Sauer, MD

## 2023-08-06 ENCOUNTER — Other Ambulatory Visit: Payer: Self-pay

## 2023-08-06 DIAGNOSIS — E119 Type 2 diabetes mellitus without complications: Secondary | ICD-10-CM

## 2023-08-06 MED ORDER — METFORMIN HCL ER 750 MG PO TB24: ORAL_TABLET | ORAL | 0 refills | Status: DC

## 2023-09-17 ENCOUNTER — Encounter: Payer: Self-pay | Admitting: Family Medicine

## 2023-10-12 ENCOUNTER — Other Ambulatory Visit: Payer: Self-pay | Admitting: Family Medicine

## 2023-10-12 DIAGNOSIS — E119 Type 2 diabetes mellitus without complications: Secondary | ICD-10-CM

## 2023-10-15 ENCOUNTER — Other Ambulatory Visit: Payer: Self-pay | Admitting: Family Medicine

## 2023-10-15 DIAGNOSIS — I10 Essential (primary) hypertension: Secondary | ICD-10-CM

## 2023-10-26 ENCOUNTER — Encounter: Payer: Commercial Managed Care - PPO | Admitting: Family Medicine

## 2023-11-05 ENCOUNTER — Ambulatory Visit: Payer: Commercial Managed Care - PPO | Admitting: Family Medicine

## 2023-11-05 ENCOUNTER — Other Ambulatory Visit: Payer: Self-pay | Admitting: Family Medicine

## 2023-11-05 VITALS — BP 120/60 | HR 65 | Ht 71.0 in | Wt 255.4 lb

## 2023-11-05 DIAGNOSIS — Z7984 Long term (current) use of oral hypoglycemic drugs: Secondary | ICD-10-CM

## 2023-11-05 DIAGNOSIS — E119 Type 2 diabetes mellitus without complications: Secondary | ICD-10-CM | POA: Diagnosis not present

## 2023-11-05 DIAGNOSIS — R351 Nocturia: Secondary | ICD-10-CM

## 2023-11-05 DIAGNOSIS — Z0000 Encounter for general adult medical examination without abnormal findings: Secondary | ICD-10-CM

## 2023-11-05 NOTE — Progress Notes (Signed)
Date:  11/05/2023   Name:  David Haley   DOB:  03-05-1956   MRN:  [id number]   Chief Complaint: Annual Exam (Patient here for annual exam. He hs ben having problems the last few months with his hand, he has trigger fingers on right and left hand. Right hand tingling.He is sleeping well and walking 2-3 days a week.)  Patient is a 67 year old male who presents for a comprehensive physical exam. The patient reports the following problems: none. Health maintenance has been reviewed up to date      Lab Results  Component Value Date   NA 136 (A) 08/27/2022   K 4.8 08/27/2022   CO2 23 10/15/2021   GLUCOSE 175 (H) 10/15/2021   BUN 16 08/27/2022   CREATININE 0.9 08/27/2022   CALCIUM 9.5 08/27/2022   EGFR 96 08/27/2022   GFRNONAA 84 10/15/2020   Lab Results  Component Value Date   CHOL 112 08/27/2022   HDL 36 08/27/2022   LDLCALC 61 08/27/2022   TRIG 77 08/27/2022   CHOLHDL 4.0 07/25/2020   Lab Results  Component Value Date   TSH 3.30 03/12/2022   Lab Results  Component Value Date   HGBA1C 5.3 03/31/2023   Lab Results  Component Value Date   WBC 8.9 08/13/2020   HGB 14.2 08/27/2022   HCT 45 08/27/2022   MCV 90 08/13/2020   PLT 263 08/13/2020   Lab Results  Component Value Date   ALT 18 08/27/2022   AST 15 08/27/2022   ALKPHOS 69 08/27/2022   BILITOT 0.3 10/15/2021   No results found for: "25OHVITD2", "25OHVITD3", "VD25OH"   Review of Systems  Patient Active Problem List   Diagnosis Date Noted   Lateral epicondylitis, left elbow 01/13/2020   DDD (degenerative disc disease), lumbar 05/13/2019   Diabetes mellitus, type 2 (HCC) 08/26/2018   HTN (hypertension) 03/22/2014    No Known Allergies  Past Surgical History:  Procedure Laterality Date   ANKLE SURGERY Right 2008   tendon repair   APPENDECTOMY  1987   HERNIA REPAIR  2004   umbilical   HERNIA REPAIR  2014   mesh   precancerous mole Left 01/2018   shoulder   SHOULDER SURGERY Left 1992    arthroscopy   SQUAMOUS CELL CARCINOMA EXCISION Right 2017   R) arm    TONSILLECTOMY  1968    Social History   Tobacco Use   Smoking status: Never   Smokeless tobacco: Never  Substance Use Topics   Alcohol use: Yes    Comment: 5-6 /week   Drug use: Yes    Types: Marijuana     Medication list has been reviewed and updated.  Current Meds  Medication Sig   Ascorbic Acid (VITAMIN C) 1000 MG tablet Take 3,000 mg by mouth daily.   aspirin EC (ASPIRIN LOW DOSE) 81 MG tablet TAKE 1 TABLET BY MOUTH ONCE DAILY   atorvastatin (LIPITOR) 20 MG tablet Take 20 mg by mouth daily. endo   ketoconazole (NIZORAL) 2 % shampoo Apply 1 application topically daily.   losartan-hydrochlorothiazide (HYZAAR) 50-12.5 MG tablet TAKE 1 TABLET BY MOUTH DAILY   metFORMIN (GLUCOPHAGE-XR) 750 MG 24 hr tablet TAKE 1 TABLET(750 MG) BY MOUTH DAILY WITH BREAKFAST   Multiple Vitamins-Minerals (CENTRUM MEN) TABS Take 1 tablet by mouth daily.       11/05/2023    8:45 AM 04/21/2023    8:28 AM 10/20/2022    8:28 AM 04/14/2022  8:06 AM  GAD 7 : Generalized Anxiety Score  Nervous, Anxious, on Edge 0 0 0 0  Control/stop worrying 0 0 0 0  Worry too much - different things 0 0 0 0  Trouble relaxing 0 0 0 0  Restless 0 0 0 0  Easily annoyed or irritable 0 0 0 0  Afraid - awful might happen 0 0 0 0  Total GAD 7 Score 0 0 0 0  Anxiety Difficulty Not difficult at all Not difficult at all Not difficult at all Not difficult at all       11/05/2023    8:45 AM 04/21/2023    8:28 AM 10/20/2022    8:28 AM  Depression screen PHQ 2/9  Decreased Interest 0 0 0  Down, Depressed, Hopeless 0 0 0  PHQ - 2 Score 0 0 0  Altered sleeping 0 0 0  Tired, decreased energy 0 0 0  Change in appetite 0 0 0  Feeling bad or failure about yourself  0 0 0  Trouble concentrating 0 0 0  Moving slowly or fidgety/restless 0 0 0  Suicidal thoughts 0 0 0  PHQ-9 Score 0 0 0  Difficult doing work/chores Not difficult at all Not difficult  at all Not difficult at all    BP Readings from Last 3 Encounters:  11/05/23 120/60  04/21/23 104/64  10/20/22 120/62    Physical Exam Vitals and nursing note reviewed.  Constitutional:      Appearance: Normal appearance. He is overweight.  HENT:     Head: Normocephalic.     Right Ear: Hearing, tympanic membrane, ear canal and external ear normal.     Left Ear: Hearing, tympanic membrane, ear canal and external ear normal.     Nose: Nose normal.     Mouth/Throat:     Mouth: Mucous membranes are moist.     Dentition: Normal dentition.     Tongue: No lesions.     Palate: No mass.     Pharynx: Oropharynx is clear. Uvula midline. No pharyngeal swelling.  Eyes:     General: Lids are normal. Vision grossly intact. Gaze aligned appropriately. No scleral icterus.       Right eye: No discharge.        Left eye: No discharge.     Extraocular Movements: Extraocular movements intact.     Conjunctiva/sclera: Conjunctivae normal.     Pupils: Pupils are equal, round, and reactive to light.     Funduscopic exam:    Right eye: Red reflex present.        Left eye: Red reflex present. Neck:     Thyroid: No thyroid mass, thyromegaly or thyroid tenderness.     Vascular: Normal carotid pulses. No carotid bruit, hepatojugular reflux or JVD.     Trachea: Trachea and phonation normal. No tracheal deviation.  Cardiovascular:     Rate and Rhythm: Normal rate and regular rhythm.     Pulses:          Carotid pulses are 2+ on the right side and 2+ on the left side.      Radial pulses are 2+ on the right side and 2+ on the left side.       Femoral pulses are 2+ on the right side and 2+ on the left side.      Popliteal pulses are 2+ on the right side and 2+ on the left side.       Dorsalis pedis pulses are 2+  on the right side and 2+ on the left side.       Posterior tibial pulses are 2+ on the right side and 2+ on the left side.     Heart sounds: Normal heart sounds, S1 normal and S2 normal. No  murmur heard.    No systolic murmur is present.     No diastolic murmur is present.     No friction rub. No gallop. No S3 or S4 sounds.  Pulmonary:     Effort: No respiratory distress.     Breath sounds: Normal breath sounds. No decreased breath sounds, wheezing, rhonchi or rales.  Chest:  Breasts:    Right: Normal. No swelling or mass.     Left: No swelling or mass.  Abdominal:     General: Bowel sounds are normal.     Palpations: Abdomen is soft. There is no hepatomegaly, splenomegaly or mass.     Tenderness: There is no abdominal tenderness. There is no guarding or rebound.  Genitourinary:    Penis: Normal.      Testes: Normal.        Right: Mass not present.        Left: Mass not present.     Epididymis:     Right: Normal.     Left: Normal.     Prostate: Normal. Not enlarged, not tender and no nodules present.     Rectum: Normal. Guaiac result negative. No mass.  Musculoskeletal:        General: No tenderness. Normal range of motion.     Cervical back: Normal range of motion and neck supple.     Right lower leg: No edema.     Left lower leg: No edema.  Lymphadenopathy:     Head:     Right side of head: No submandibular or tonsillar adenopathy.     Left side of head: No submandibular or tonsillar adenopathy.     Cervical: No cervical adenopathy.     Right cervical: No superficial, deep or posterior cervical adenopathy.    Left cervical: No superficial, deep or posterior cervical adenopathy.     Upper Body:     Right upper body: No supraclavicular or axillary adenopathy.     Left upper body: No supraclavicular or axillary adenopathy.  Skin:    General: Skin is warm.     Capillary Refill: Capillary refill takes less than 2 seconds.     Findings: No rash.  Neurological:     Mental Status: He is alert and oriented to person, place, and time.     Cranial Nerves: Cranial nerves 2-12 are intact. No cranial nerve deficit.     Sensory: Sensation is intact.     Motor: Motor  function is intact.     Deep Tendon Reflexes: Reflexes are normal and symmetric.  Psychiatric:        Behavior: Behavior is cooperative.     Wt Readings from Last 3 Encounters:  11/05/23 255 lb 6.4 oz (115.8 kg)  04/21/23 227 lb (103 kg)  10/20/22 238 lb (108 kg)    BP 120/60   Pulse 65   Ht 5\' 11"  (1.803 m)   Wt 255 lb 6.4 oz (115.8 kg)   SpO2 97%   BMI 35.62 kg/m   Assessment and Plan:  Kaylyn Tafur is a 67 y.o. male who presents today for his Complete Annual Exam. He feels well. He reports exercising . He reports he is sleeping well. Immunizations are reviewed and recommendations  provided.   Age appropriate screening tests are discussed. Counseling given for risk factor reduction interventions.   1. Annual physical exam (Primary) No subjective/objective concerns noted during HPI, review of past medical history/review of past labs/review of medications, review of systems and physical exam.  We will check labs including CBC, lipid, and CMP.  We will recheck patient in 6 months for medication refill - CBC with Differential/Platelet - Lipid Panel With LDL/HDL Ratio - Hemoglobin A1c - Comprehensive metabolic panel  2. Type 2 diabetes mellitus without complication, without long-term current use of insulin (HCC) Chronic.  Patient has been followed by [facility] endocrinology experimental protocol with labs.  Patient has not had A1c done in almost 6 months.  And we will check A1c lipid and CMP for patient and address accordingly. - Lipid Panel With LDL/HDL Ratio - Hemoglobin A1c - Comprehensive metabolic panel  3. Nocturia Patient has history of nocturia DRE is normal.  We will check PSA for current status. - PSA   Elizabeth Sauer, MD

## 2023-11-06 ENCOUNTER — Other Ambulatory Visit: Payer: Self-pay | Admitting: Family Medicine

## 2023-11-06 ENCOUNTER — Encounter: Payer: Self-pay | Admitting: Family Medicine

## 2023-11-06 DIAGNOSIS — E119 Type 2 diabetes mellitus without complications: Secondary | ICD-10-CM

## 2023-11-06 LAB — CBC WITH DIFFERENTIAL/PLATELET
Basophils Absolute: 0.1 10*3/uL (ref 0.0–0.2)
Basos: 1 %
EOS (ABSOLUTE): 0.2 10*3/uL (ref 0.0–0.4)
Eos: 3 %
Hematocrit: 44.5 % (ref 37.5–51.0)
Hemoglobin: 14.6 g/dL (ref 13.0–17.7)
Immature Grans (Abs): 0 10*3/uL (ref 0.0–0.1)
Immature Granulocytes: 1 %
Lymphocytes Absolute: 2.3 10*3/uL (ref 0.7–3.1)
Lymphs: 29 %
MCH: 30 pg (ref 26.6–33.0)
MCHC: 32.8 g/dL (ref 31.5–35.7)
MCV: 91 fL (ref 79–97)
Monocytes Absolute: 0.7 10*3/uL (ref 0.1–0.9)
Monocytes: 9 %
Neutrophils Absolute: 4.5 10*3/uL (ref 1.4–7.0)
Neutrophils: 57 %
Platelets: 233 10*3/uL (ref 150–450)
RBC: 4.87 x10E6/uL (ref 4.14–5.80)
RDW: 11.8 % (ref 11.6–15.4)
WBC: 7.8 10*3/uL (ref 3.4–10.8)

## 2023-11-06 LAB — COMPREHENSIVE METABOLIC PANEL
ALT: 16 [IU]/L (ref 0–44)
AST: 14 [IU]/L (ref 0–40)
Albumin: 4.3 g/dL (ref 3.9–4.9)
Alkaline Phosphatase: 65 [IU]/L (ref 44–121)
BUN/Creatinine Ratio: 20 (ref 10–24)
BUN: 19 mg/dL (ref 8–27)
Bilirubin Total: 0.5 mg/dL (ref 0.0–1.2)
CO2: 23 mmol/L (ref 20–29)
Calcium: 9.8 mg/dL (ref 8.6–10.2)
Chloride: 101 mmol/L (ref 96–106)
Creatinine, Ser: 0.93 mg/dL (ref 0.76–1.27)
Globulin, Total: 3.1 g/dL (ref 1.5–4.5)
Glucose: 138 mg/dL — ABNORMAL HIGH (ref 70–99)
Potassium: 4.5 mmol/L (ref 3.5–5.2)
Sodium: 139 mmol/L (ref 134–144)
Total Protein: 7.4 g/dL (ref 6.0–8.5)
eGFR: 91 mL/min/{1.73_m2} (ref >59)

## 2023-11-06 LAB — PSA: Prostate Specific Ag, Serum: 0.5 ng/mL (ref 0.0–4.0)

## 2023-11-06 LAB — LIPID PANEL WITH LDL/HDL RATIO
Cholesterol, Total: 112 mg/dL (ref 100–199)
HDL: 50 mg/dL (ref >39)
LDL Chol Calc (NIH): 48 mg/dL (ref 0–99)
LDL/HDL Ratio: 1 {ratio} (ref 0.0–3.6)
Triglycerides: 62 mg/dL (ref 0–149)
VLDL Cholesterol Cal: 14 mg/dL (ref 5–40)

## 2023-11-26 ENCOUNTER — Ambulatory Visit: Payer: Commercial Managed Care - PPO | Admitting: Family Medicine

## 2023-12-11 ENCOUNTER — Other Ambulatory Visit: Payer: Self-pay | Admitting: Family Medicine

## 2023-12-11 DIAGNOSIS — E119 Type 2 diabetes mellitus without complications: Secondary | ICD-10-CM

## 2024-01-10 ENCOUNTER — Other Ambulatory Visit: Payer: Self-pay | Admitting: Family Medicine

## 2024-01-10 DIAGNOSIS — E119 Type 2 diabetes mellitus without complications: Secondary | ICD-10-CM

## 2024-01-12 ENCOUNTER — Other Ambulatory Visit: Payer: Self-pay | Admitting: Family Medicine

## 2024-01-12 DIAGNOSIS — I10 Essential (primary) hypertension: Secondary | ICD-10-CM

## 2024-03-14 ENCOUNTER — Other Ambulatory Visit: Payer: Self-pay | Admitting: Family Medicine

## 2024-03-14 DIAGNOSIS — I10 Essential (primary) hypertension: Secondary | ICD-10-CM

## 2024-04-09 ENCOUNTER — Other Ambulatory Visit: Payer: Self-pay | Admitting: Family Medicine

## 2024-04-09 DIAGNOSIS — E119 Type 2 diabetes mellitus without complications: Secondary | ICD-10-CM
